# Patient Record
Sex: Female | Born: 2000 | Race: White | Hispanic: No | Marital: Single | Smoking: Former smoker
Health system: Southern US, Community
[De-identification: ages and names within clinical notes are randomized; demographics above are authoritative.]

## PROBLEM LIST (undated history)

## (undated) DIAGNOSIS — N3644 Muscular disorders of urethra: Secondary | ICD-10-CM

## (undated) DIAGNOSIS — J309 Allergic rhinitis, unspecified: Secondary | ICD-10-CM

## (undated) DIAGNOSIS — T50902A Poisoning by unspecified drugs, medicaments and biological substances, intentional self-harm, initial encounter: Secondary | ICD-10-CM

## (undated) DIAGNOSIS — T7840XA Allergy, unspecified, initial encounter: Secondary | ICD-10-CM

## (undated) DIAGNOSIS — N390 Urinary tract infection, site not specified: Secondary | ICD-10-CM

## (undated) DIAGNOSIS — F319 Bipolar disorder, unspecified: Secondary | ICD-10-CM

## (undated) DIAGNOSIS — N1370 Vesicoureteral-reflux, unspecified: Secondary | ICD-10-CM

## (undated) DIAGNOSIS — F603 Borderline personality disorder: Secondary | ICD-10-CM

## (undated) DIAGNOSIS — F419 Anxiety disorder, unspecified: Secondary | ICD-10-CM

## (undated) HISTORY — PX: NO PAST SURGERIES: SHX2092

---
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
## 2022-10-30 ENCOUNTER — Ambulatory Visit: Payer: BLUE CROSS/BLUE SHIELD | Admitting: Cardiology

## 2022-12-23 ENCOUNTER — Encounter: Payer: Self-pay | Admitting: Interventional Cardiology

## 2022-12-23 ENCOUNTER — Ambulatory Visit: Payer: BC Managed Care – PPO | Attending: Interventional Cardiology | Admitting: Interventional Cardiology

## 2022-12-23 VITALS — BP 134/54 | HR 112 | Ht 60.0 in | Wt 136.2 lb

## 2022-12-23 DIAGNOSIS — Z7289 Other problems related to lifestyle: Secondary | ICD-10-CM

## 2022-12-23 DIAGNOSIS — R002 Palpitations: Secondary | ICD-10-CM

## 2022-12-23 DIAGNOSIS — I479 Paroxysmal tachycardia, unspecified: Secondary | ICD-10-CM | POA: Diagnosis not present

## 2022-12-23 DIAGNOSIS — R0602 Shortness of breath: Secondary | ICD-10-CM

## 2022-12-23 NOTE — Patient Instructions (Signed)
Medication Instructions:  Your physician recommends that you continue on your current medications as directed. Please refer to the Current Medication list given to you today.  *If you need a refill on your cardiac medications before your next appointment, please call your pharmacy*   Lab Work: none If you have labs (blood work) drawn today and your tests are completely normal, you will receive your results only by: [place] (if you have MyChart) OR A paper copy in the mail If you have any lab test that is abnormal or we need to change your treatment, we will call you to review the results.   Testing/Procedures: Your physician has requested that you have an echocardiogram. Echocardiography is a painless test that uses sound waves to create images of your heart. It provides your doctor with information about the size and shape of your heart and how well your heart's chambers and valves are working. This procedure takes approximately one hour. There are no restrictions for this procedure. Please do NOT wear cologne, perfume, aftershave, or lotions (deodorant is allowed). Please arrive 15 minutes prior to your appointment time.    Follow-Up: At [facility], you and your health needs are our priority.  As part of our continuing mission to provide you with exceptional heart care, we have created designated Provider Care Teams.  These Care Teams include your primary Cardiologist (physician) and Advanced Practice Providers (APPs -  Physician Assistants and Nurse Practitioners) who all work together to provide you with the care you need, when you need it.  We recommend signing up for the patient portal called "MyChart".  Sign up information is provided on this After Visit Summary.  MyChart is used to connect with patients for Virtual Visits (Telemedicine).  Patients are able to view lab/test results, encounter notes, upcoming appointments, etc.  Non-urgent messages can be sent to your  provider as well.   To learn more about what you can do with MyChart, go to [email or web address].    Your next appointment:   As needed  Provider:   Larae Grooms, MD     Other Instructions

## 2022-12-23 NOTE — Progress Notes (Signed)
Cardiology Office Note   Date:  12/23/2022   ID:  Thelma Barge, DOB 11-23-2000, MRN [id number]  PCP:  Patient, No Pcp Per    No chief complaint on file.  Tachycardia  Wt Readings from Last 3 Encounters:  12/23/22 136 lb 3.2 oz (61.8 kg)  03/12/16 105 lb (47.6 kg) (23 %, Z= -0.73)*  12/07/11 70 lb 12.8 oz (32.1 kg) (15 %, Z= -1.05)*   * Growth percentiles are based on CDC (Girls, 2-20 Years) data.       History of Present Illness: [name] is a 22 y.o. female who is being seen today for the evaluation of tachycardia at the request of Dian Queen, MD.   She is 7 months pregnant.  HR has been in the 110-120 range.  She can feel palpitations since the second trimester started.  THere is associated SHOB.  SHe was referred in 12/23, but sx goot better.  Sx returned in Jan so she came to be evaluated.  No prior cardiac issues in the past.  She used cocaine and "uppers" in the past.  Last cocaine was 2023 and last meth was in 2020.  No drug use since pregnancy.  Has DOE with exertion. Going up the stairs or even on flat ground can cause sx.  Palpitaitons can cone on at rest as well.  SHe has decreased caffeine.  She is not feeling anxiety at the times.  Family h/o heart trouble.  Maternal GF had MI.  Maternal GM with CHF. Father with HTN.  Home blood pressure readings well-controlled but heart rates can range anywhere from 90s up to 120s.  Highest 131 on one occasion.  Most in the upper 90s.   He does vape, but no relation to palpitations.    Past Medical History:  Diagnosis Date   Allergic rhinitis    Allergy    Anxiety    Bipolar 1 disorder ([place])    Bladder sphincter dyssynergia    Borderline personality disorder ([place])    seasonal allergies    Suicide attempt by drug ingestion ([place])    Urinary tract infection    Vesicoureteral reflux     History reviewed. No pertinent surgical history.   Current Outpatient Medications  Medication Sig Dispense  Refill   Prenatal Vit-Fe Fumarate-FA (PRENATAL MULTIVITAMIN) TABS tablet Take 1 tablet by mouth daily at 12 noon.     buPROPion (WELLBUTRIN SR) 150 MG 12 hr tablet Take 1 tablet (150 mg total) by mouth daily. (Patient not taking: Reported on 12/23/2022) 30 tablet 0   loratadine (CLARITIN) 10 MG tablet Take 1 tablet (10 mg total) by mouth daily. (Patient not taking: Reported on 12/23/2022) 30 tablet 0   Melatonin 5 MG TABS Take 5 mg by mouth at bedtime. (Patient not taking: Reported on 12/23/2022)     Oxcarbazepine (TRILEPTAL) 300 MG tablet Take 3 tablets (900 mg total) by mouth 2 (two) times daily. (Patient not taking: Reported on 12/23/2022) 90 tablet 0   sertraline (ZOLOFT) 100 MG tablet Take 1 tablet (100 mg total) by mouth daily. (Patient not taking: Reported on 12/23/2022) 30 tablet 0   No current facility-administered medications for this visit.    Allergies:   Patient has no known allergies.    Social History:  The patient  reports that she has been smoking cigarettes. She has been smoking an average of .25 packs per day. She has never used smokeless tobacco. She reports current drug use. Drugs: Cocaine, Marijuana, Other-see comments,  and LSD.   Family History:  The patient's family history includes Bipolar disorder in her mother; Drug abuse in her mother.    ROS:  Please see the history of present illness.   Otherwise, review of systems are positive for DOE.   All other systems are reviewed and negative.    PHYSICAL EXAM: VS:  BP (!) 134/54   Pulse (!) 112   Ht 5' (1.524 m)   Wt 136 lb 3.2 oz (61.8 kg)   SpO2 98%   BMI 26.60 kg/m  , BMI Body mass index is 26.6 kg/m. GEN: Well nourished, well developed, in no acute distress HEENT: normal Neck: no JVD, carotid bruits, or masses Cardiac: RRR; 2/6 early systolicmurmur-typical pregnancy, no rubs, or gallops,no edema  Respiratory:  clear to auscultation bilaterally, normal work of breathing GI: soft, nontender, nondistended, +  BS MS: no deformity or atrophy Skin: warm and dry, no rash Neuro:  Strength and sensation are intact Psych: euthymic mood, full affect   EKG:   The ekg ordered today demonstrates sinus tach   Recent Labs: No results found for requested labs within last 365 days.   Lipid Panel    Component Value Date/Time   CHOL 166 03/15/2016 0633   TRIG 94 03/15/2016 0633   HDL 43 03/15/2016 0633   CHOLHDL 3.9 03/15/2016 0633   VLDL 19 03/15/2016 0633   LDLCALC 104 (H) 03/15/2016 [id number]     Other studies Reviewed: Additional studies/ records that were reviewed today with results demonstrating:most recent labs unavailable to me .   ASSESSMENT AND PLAN:  Tachycardia/palpitations: Appear to be sinus tachycardia.  She is already decreasing caffeine.  We spoke about the importance of staying well-hydrated.  She is avoiding stimulant drugs now.  She does continue to vape which certainly we would like her to stop for her long-term health. DOE: Check echo to evaluate for any structural heart disease.  No symptoms of heart failure noted on exam. Prior substance abuse: Now only vaping.  No prior cardiac issues.   Current medicines are reviewed at length with the patient today.  The patient concerns regarding her medicines were addressed.  The following changes have been made:  No change  Labs/ tests ordered today include:  No orders of the defined types were placed in this encounter.   Recommend 150 minutes/week of aerobic exercise Low fat, low carb, high fiber diet recommended  Disposition:   FU for echo; prn f/u unless there is a significant abnormality on echo   Signed, Larae Grooms, MD  12/23/2022 10:02 AM    [facility] [place], [place], [place]  [address] Phone: [phone]; Fax: [phone]

## 2022-12-24 ENCOUNTER — Ambulatory Visit (HOSPITAL_COMMUNITY): Payer: BC Managed Care – PPO | Attending: Interventional Cardiology

## 2022-12-24 DIAGNOSIS — R0602 Shortness of breath: Secondary | ICD-10-CM | POA: Diagnosis not present

## 2022-12-24 LAB — ECHOCARDIOGRAM COMPLETE
Area-P 1/2: 5.13 cm2
S' Lateral: 2.3 cm

## 2022-12-25 ENCOUNTER — Telehealth: Payer: Self-pay | Admitting: Interventional Cardiology

## 2022-12-25 NOTE — Telephone Encounter (Signed)
Results reviewed with patient

## 2022-12-25 NOTE — Telephone Encounter (Signed)
-----   Message from Jettie Booze, MD sent at 12/25/2022  3:45 PM EST ----- Normal LV/RV/valvular function.  Prn f/u

## 2022-12-25 NOTE — Telephone Encounter (Signed)
Patient is returning call to discuss echo results. °

## 2022-12-28 ENCOUNTER — Encounter: Payer: Self-pay | Admitting: Student

## 2022-12-28 ENCOUNTER — Inpatient Hospital Stay (HOSPITAL_COMMUNITY)
Admission: AD | Admit: 2022-12-28 | Discharge: 2022-12-28 | Disposition: A | Discharge status: Home / Self Care | Payer: BC Managed Care – PPO | Attending: Obstetrics and Gynecology | Admitting: Obstetrics and Gynecology

## 2022-12-28 DIAGNOSIS — O368130 Decreased fetal movements, third trimester, not applicable or unspecified: Secondary | ICD-10-CM | POA: Insufficient documentation

## 2022-12-28 DIAGNOSIS — O26893 Other specified pregnancy related conditions, third trimester: Secondary | ICD-10-CM | POA: Insufficient documentation

## 2022-12-28 DIAGNOSIS — O479 False labor, unspecified: Secondary | ICD-10-CM

## 2022-12-28 DIAGNOSIS — R109 Unspecified abdominal pain: Secondary | ICD-10-CM | POA: Insufficient documentation

## 2022-12-28 DIAGNOSIS — Z3A31 31 weeks gestation of pregnancy: Secondary | ICD-10-CM

## 2022-12-28 DIAGNOSIS — O4703 False labor before 37 completed weeks of gestation, third trimester: Secondary | ICD-10-CM | POA: Insufficient documentation

## 2022-12-28 LAB — URINALYSIS, ROUTINE W REFLEX MICROSCOPIC
Bilirubin Urine: NEGATIVE
Glucose, UA: NEGATIVE mg/dL
Hgb urine dipstick: NEGATIVE
Ketones, ur: NEGATIVE mg/dL
Leukocytes,Ua: NEGATIVE
Nitrite: NEGATIVE
Protein, ur: NEGATIVE mg/dL
Specific Gravity, Urine: <1.005 — ABNORMAL LOW (ref 1.005–1.030)
pH: 6.5 (ref 5.0–8.0)

## 2022-12-28 LAB — FETAL FIBRONECTIN: Fetal Fibronectin: NEGATIVE

## 2022-12-28 NOTE — MAU Note (Addendum)
[name] is a 22 y.o. at 18w3dhere in MAU reporting: had her check up at dr.  Since last appt 2 wks ago, has been feeling cramping and baby hasn't been moving as much. They did a NST, baby was moving ok, contracting every 5-6 min.  So sent over for fFN and exam  Onset of complaint: mild Pain score: mile Vitals:   12/28/22 1354  BP: 134/73  Pulse: (!) 105  Resp: 16  Temp: 98.4 F (36.9 C)  SpO2: 100%     FHT:134 Lab orders placed from triage:    Urine collected

## 2022-12-28 NOTE — MAU Provider Note (Addendum)
History     [id number]  Arrival date and time: 12/28/22 1337    Chief Complaint  Patient presents with   Abdominal Pain     HPI [name] is a 22 y.o. at 44w3dwho presents for contractions. Was in the office today for routine OB. Reports decreased fetal movement at the time so had an NST which showed she was contracting. Patient reports cramping for the last week but was told at her last visit that was normal. No change in cramping & can't tell how frequently it happens. Was sent here by her OB for FFN & cervical exam. Denies LOF, n/v/d, dysuria, vaginal bleeding, or recent intercourse. Reports good fetal movement since her office visit.   OB History     Gravida  1   Para      Term      Preterm      AB      Living         SAB      IAB      Ectopic      Multiple      Live Births              Past Medical History:  Diagnosis Date   Allergy    Anxiety    Bipolar 1 disorder ([place]    Bladder sphincter dyssynergia    Borderline personality disorder ([place]    [place]    Urinary tract infection    Vesicoureteral reflux     History reviewed. No pertinent surgical history.  Family History  Problem Relation Age of Onset   Bipolar disorder Mother    Drug abuse Mother    High blood pressure Father     No Known Allergies  No current facility-administered medications on file prior to encounter.   Current Outpatient Medications on File Prior to Encounter  Medication Sig Dispense Refill   Prenatal Vit-Fe Fumarate-FA (PRENATAL MULTIVITAMIN) TABS tablet Take 1 tablet by mouth daily at 12 noon.       ROS Pertinent positives and negative per HPI, all others reviewed and negative  Physical Exam   BP 124/75   Pulse 100   Temp 98.4 F (36.9 C) (Oral)   Resp 16   Ht 5' (1.524 m)   Wt 65.4 kg   SpO2 100%   BMI 28.16 kg/m   Patient Vitals for the past 24 hrs:  BP Temp Temp src Pulse Resp SpO2 Height Weight   12/28/22 1605 124/75 -- -- 100 -- -- -- --  12/28/22 1410 134/82 -- -- (!) 109 -- -- -- --  12/28/22 1354 134/73 98.4 F (36.9 C) Oral (!) 105 16 100 % 5' (1.524 m) 65.4 kg    Physical Exam Vitals and nursing note reviewed. Exam conducted with a chaperone present.  Constitutional:      General: She is not in acute distress. HENT:     Head: Normocephalic and atraumatic.  Pulmonary:     Effort: Pulmonary effort is normal. No respiratory distress.  Abdominal:     Palpations: Abdomen is soft.     Tenderness: There is no abdominal tenderness.     Comments: gravid  Skin:    General: Skin is warm and dry.  Neurological:     Mental Status: She is alert.      Cervical Exam Dilation: Closed Effacement (%): Thick Exam by:: EJorje Guild NP  FHT Baseline 130, moderate variability, 15x15 accels, no decels Toco:  irregular Cat: 1  Labs Results for orders placed or performed during the hospital encounter of 12/28/22 (from the past 24 hour(s))  Urinalysis, Routine w reflex microscopic -Urine, Clean Catch     Status: Abnormal   Collection Time: 12/28/22  2:19 PM  Result Value Ref Range   Color, Urine YELLOW YELLOW   APPearance CLEAR CLEAR   Specific Gravity, Urine <1.005 (L) 1.005 - 1.030   pH 6.5 5.0 - 8.0   Glucose, UA NEGATIVE NEGATIVE mg/dL   Hgb urine dipstick NEGATIVE NEGATIVE   Bilirubin Urine NEGATIVE NEGATIVE   Ketones, ur NEGATIVE NEGATIVE mg/dL   Protein, ur NEGATIVE NEGATIVE mg/dL   Nitrite NEGATIVE NEGATIVE   Leukocytes,Ua NEGATIVE NEGATIVE  Fetal fibronectin     Status: None   Collection Time: 12/28/22  2:44 PM  Result Value Ref Range   Fetal Fibronectin NEGATIVE NEGATIVE    Imaging No results found.  MAU Course  Procedures Lab Orders         Urinalysis, Routine w reflex microscopic -Urine, Clean Catch         Fetal fibronectin    No orders of the defined types were placed in this encounter.  Imaging Orders  No imaging studies ordered today    MDM Patient sent from office for FFN & cervical exam FFN negative. Cervix closed/thick/-3. No regular contractions on toco Assessment and Plan   1. Braxton Hick's contraction   2. [redacted] weeks gestation of pregnancy    -Reviewed preterm labor precautions & reasons to return to MAU -Keep scheduled f/u with OB   Jorje Guild, NP 12/28/22 5:30 PM

## 2023-01-12 ENCOUNTER — Encounter (HOSPITAL_COMMUNITY): Payer: Self-pay | Admitting: Obstetrics & Gynecology

## 2023-01-12 ENCOUNTER — Inpatient Hospital Stay (HOSPITAL_COMMUNITY)
Admission: AD | Admit: 2023-01-12 | Discharge: 2023-01-17 | DRG: 787 | Disposition: A | Discharge status: Home / Self Care | Payer: BC Managed Care – PPO | Attending: Obstetrics & Gynecology | Admitting: Obstetrics & Gynecology

## 2023-01-12 ENCOUNTER — Other Ambulatory Visit: Payer: Self-pay

## 2023-01-12 DIAGNOSIS — O42913 Preterm premature rupture of membranes, unspecified as to length of time between rupture and onset of labor, third trimester: Secondary | ICD-10-CM | POA: Diagnosis present

## 2023-01-12 DIAGNOSIS — O9852 Other viral diseases complicating childbirth: Secondary | ICD-10-CM | POA: Diagnosis present

## 2023-01-12 DIAGNOSIS — B001 Herpesviral vesicular dermatitis: Secondary | ICD-10-CM | POA: Diagnosis present

## 2023-01-12 DIAGNOSIS — O99334 Smoking (tobacco) complicating childbirth: Secondary | ICD-10-CM | POA: Diagnosis present

## 2023-01-12 DIAGNOSIS — Z9151 Personal history of suicidal behavior: Secondary | ICD-10-CM | POA: Diagnosis not present

## 2023-01-12 DIAGNOSIS — F17290 Nicotine dependence, other tobacco product, uncomplicated: Secondary | ICD-10-CM | POA: Diagnosis present

## 2023-01-12 DIAGNOSIS — O42919 Preterm premature rupture of membranes, unspecified as to length of time between rupture and onset of labor, unspecified trimester: Secondary | ICD-10-CM | POA: Diagnosis present

## 2023-01-12 DIAGNOSIS — Z8619 Personal history of other infectious and parasitic diseases: Secondary | ICD-10-CM | POA: Diagnosis not present

## 2023-01-12 DIAGNOSIS — Z3A33 33 weeks gestation of pregnancy: Secondary | ICD-10-CM | POA: Diagnosis not present

## 2023-01-12 DIAGNOSIS — O26893 Other specified pregnancy related conditions, third trimester: Secondary | ICD-10-CM | POA: Diagnosis present

## 2023-01-12 LAB — RAPID URINE DRUG SCREEN, HOSP PERFORMED
Amphetamines: NOT DETECTED
Barbiturates: NOT DETECTED
Benzodiazepines: NOT DETECTED
Cocaine: NOT DETECTED
Opiates: NOT DETECTED
Tetrahydrocannabinol: NOT DETECTED

## 2023-01-12 LAB — URINALYSIS, ROUTINE W REFLEX MICROSCOPIC
Bilirubin Urine: NEGATIVE
Glucose, UA: NEGATIVE mg/dL
Hgb urine dipstick: NEGATIVE
Ketones, ur: NEGATIVE mg/dL
Leukocytes,Ua: NEGATIVE
Nitrite: NEGATIVE
Protein, ur: NEGATIVE mg/dL
Specific Gravity, Urine: 1.002 — ABNORMAL LOW (ref 1.005–1.030)
pH: 7 (ref 5.0–8.0)

## 2023-01-12 LAB — TYPE AND SCREEN
ABO/RH(D): O POS
Antibody Screen: NEGATIVE

## 2023-01-12 LAB — HIV ANTIBODY (ROUTINE TESTING W REFLEX): HIV Screen 4th Generation wRfx: NONREACTIVE

## 2023-01-12 LAB — POCT FERN TEST: POCT Fern Test: POSITIVE

## 2023-01-12 MED ORDER — BETAMETHASONE SOD PHOS & ACET 6 (3-3) MG/ML IJ SUSP
12.0000 mg | Freq: Once | INTRAMUSCULAR | Status: AC
  Administered 2023-01-12: 12 mg via INTRAMUSCULAR
  Filled 2023-01-12: qty 5

## 2023-01-12 MED ORDER — DOCUSATE SODIUM 100 MG PO CAPS
100.0000 mg | ORAL_CAPSULE | Freq: Every day | ORAL | Status: DC
  Administered 2023-01-12 – 2023-01-13 (×2): 100 mg via ORAL
  Filled 2023-01-12 (×2): qty 1

## 2023-01-12 MED ORDER — ZOLPIDEM TARTRATE 5 MG PO TABS
5.0000 mg | ORAL_TABLET | Freq: Every evening | ORAL | Status: DC | PRN
  Administered 2023-01-12: 5 mg via ORAL
  Filled 2023-01-12: qty 1

## 2023-01-12 MED ORDER — LACTATED RINGERS IV BOLUS
1000.0000 mL | Freq: Once | INTRAVENOUS | Status: AC
  Administered 2023-01-12: 1000 mL via INTRAVENOUS

## 2023-01-12 MED ORDER — SODIUM CHLORIDE 0.9% FLUSH: 3.0000 mL | INTRAVENOUS | Status: DC | PRN

## 2023-01-12 MED ORDER — AZITHROMYCIN 250 MG PO TABS
1000.0000 mg | ORAL_TABLET | Freq: Once | ORAL | Status: AC
  Administered 2023-01-12: 1000 mg via ORAL
  Filled 2023-01-12: qty 4

## 2023-01-12 MED ORDER — CALCIUM CARBONATE ANTACID 500 MG PO CHEW: 2.0000 | CHEWABLE_TABLET | ORAL | Status: DC | PRN

## 2023-01-12 MED ORDER — AMOXICILLIN 500 MG PO CAPS: 500.0000 mg | ORAL_CAPSULE | Freq: Three times a day (TID) | ORAL | Status: DC

## 2023-01-12 MED ORDER — AZITHROMYCIN 250 MG PO TABS: 1000.0000 mg | ORAL_TABLET | Freq: Once | ORAL | Status: DC

## 2023-01-12 MED ORDER — PRENATAL MULTIVITAMIN CH
1.0000 | ORAL_TABLET | Freq: Every day | ORAL | Status: DC
  Filled 2023-01-12: qty 1

## 2023-01-12 MED ORDER — SODIUM CHLORIDE 0.9 % IV SOLN
250.0000 mL | INTRAVENOUS | Status: DC | PRN
  Administered 2023-01-12: 250 mL via INTRAVENOUS

## 2023-01-12 MED ORDER — SODIUM CHLORIDE 0.9 % IV SOLN: 250.0000 mL | INTRAVENOUS | Status: DC | PRN

## 2023-01-12 MED ORDER — VALACYCLOVIR HCL 500 MG PO TABS: 500.0000 mg | ORAL_TABLET | Freq: Two times a day (BID) | ORAL | Status: DC

## 2023-01-12 MED ORDER — ACETAMINOPHEN 325 MG PO TABS: 650.0000 mg | ORAL_TABLET | ORAL | Status: DC | PRN

## 2023-01-12 MED ORDER — SODIUM CHLORIDE 0.9 % IV SOLN
2.0000 g | Freq: Four times a day (QID) | INTRAVENOUS | Status: DC
  Administered 2023-01-12 – 2023-01-13 (×4): 2 g via INTRAVENOUS
  Filled 2023-01-12 (×6): qty 2000

## 2023-01-12 MED ORDER — SODIUM CHLORIDE 0.9% FLUSH
3.0000 mL | Freq: Two times a day (BID) | INTRAVENOUS | Status: DC
  Administered 2023-01-12 – 2023-01-13 (×2): 3 mL via INTRAVENOUS

## 2023-01-12 MED ORDER — SODIUM CHLORIDE 0.9 % IV SOLN: 2.0000 g | Freq: Once | INTRAVENOUS | Status: DC

## 2023-01-12 MED ORDER — LACTATED RINGERS IV SOLN: 125.0000 mL/h | INTRAVENOUS | Status: DC

## 2023-01-12 NOTE — H&P (Signed)
[name] is a 22 y.o. female G1 at 46w4dpresenting to MAU with c/o CTX.  On exam, watery fluid was noted and patient ruled in for PPROM; unknown time of ROM.  Patient reports CTX are stable in intensity; not worsening.  No VB.  Active FM.  Patient was visually closed on SSE in MAU and vertex presentation by u/s.  Antepartum course complicated by h/o depression/anxiety and borderline personality disorder; on no medication.  Patient is in therapy.  Patient has h/o multiple suicide attempts at age 22  Patient also has h/o substance abuse and had inpatient rehab for cocaine and alcohol.  Hx also of marijuana and LSD use; clean x 3 years.  Patient does use e-cigarettes.  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  Patient has h/o syphillis in 05/2022 and is s/p tx.    OB History     Gravida  1   Para      Term      Preterm      AB      Living         SAB      IAB      Ectopic      Multiple      Live Births             Past Medical History:  Diagnosis Date   Allergy    Anxiety    [place] 1 disorder ([place]    Bladder sphincter dyssynergia    Borderline personality disorder ([place]    [place] by drug ingestion ([place]    Urinary tract infection    Vesicoureteral reflux    Past Surgical History:  Procedure Laterality Date   NO PAST SURGERIES     Family History: family history includes Bipolar disorder in her mother; Drug abuse in her mother; High blood pressure in her father. Social History:  reports that she has quit smoking. Her smoking use included cigarettes. She smoked an average of .25 packs per day. She has never used smokeless tobacco. She reports that she does not currently use alcohol. She reports that she does not currently use drugs after having used the following drugs: Cocaine, Marijuana, Other-see comments, and LSD.     Maternal Diabetes: No Genetic Screening: Normal Maternal Ultrasounds/Referrals: Normal Fetal Ultrasounds or other Referrals:   None Maternal Substance Abuse:  Yes:  Type: Other: e cigarettes Significant Maternal Medications:  None Significant Maternal Lab Results:  None Number of Prenatal Visits:greater than 3 verified prenatal visits Other Comments:  None  Review of Systems Maternal Medical History:  Reason for admission: Rupture of membranes.   Contractions: Onset was 3-5 hours ago.   Frequency: irregular.   Perceived severity is mild.   Fetal activity: Perceived fetal activity is normal.   Last perceived fetal movement was within the past hour.   Prenatal complications: no prenatal complications Prenatal Complications - Diabetes: none.     Blood pressure (!) 120/57, pulse 84, temperature 98.2 F (36.8 C), resp. rate 18. Maternal Exam:  Uterine Assessment: Contraction strength is mild.  Contraction frequency is irregular.  Abdomen: Patient reports no abdominal tenderness. Fundal height is c/w dates.   Fetal presentation: vertex   Fetal Exam Fetal Monitor Review: Baseline rate: 120.  Variability: moderate (6-25 bpm).   Pattern: accelerations present and no decelerations.   Fetal State Assessment: Category I - tracings are normal.   Physical Exam Constitutional:      Appearance: Normal appearance.  HENT:  Head: Normocephalic and atraumatic.  Pulmonary:     Effort: Pulmonary effort is normal.  Abdominal:     Palpations: Abdomen is soft.  Musculoskeletal:        General: Normal range of motion.     Cervical back: Normal range of motion.  Skin:    General: Skin is warm and dry.  Neurological:     Mental Status: She is alert and oriented to person, place, and time.  Psychiatric:        Mood and Affect: Mood normal.        Behavior: Behavior normal.     Prenatal labs: ABO, Rh:  O pos Antibody:  Negative Rubella:  Immune RPR:   NR HBsAg:   Negative HIV:   NR GBS:   unknown  Assessment/Plan: 21yo G1 at 98w4dwith PPROM -Latency abx -BMZ -CEFM -GBS, GC/C pending -UDS -NICU  consult placed -MFM u/s tomorrow (ordered) -If CTX do not subside with IVF, will consider magnesium sulfate through BMZ maturity -Closely monitor for labor, abruption, chorio, non-reassuring fetal status  MLinda Hedges3/03/2023, 8:00 PM

## 2023-01-12 NOTE — MAU Provider Note (Signed)
History     CSN: [id number]  Arrival date and time: 01/12/23 1713   Event Date/Time   First Provider Initiated Contact with Patient 01/12/23 1750      Chief Complaint  Patient presents with   Contractions   HPI Patient is 22 y.o. G1P0 19w4dhere with complaints of contractions with some spotting. [facility] and said she was closed. She has some spotting when she wiped today. She reports contractions that were increasing in intensity today.  Reports no sudden gush of fluid. Denies recent intercourse.   +FM, denies LOF, vaginal discharge.   OB History     Gravida  1   Para      Term      Preterm      AB      Living         SAB      IAB      Ectopic      Multiple      Live Births              Past Medical History:  Diagnosis Date   Allergy    Anxiety    [place] 1 [place]    Bladder sphincter dyssynergia    [place]line [place]    [place]    Urinary tract infection    Vesicoureteral reflux     Past Surgical History:  Procedure Laterality Date   NO PAST SURGERIES      Family History  Problem Relation Age of Onset   Bipolar disorder Mother    Drug abuse Mother    High blood pressure Father     Social History   Tobacco Use   Smoking status: Former    Packs/day: 0.25    Types: Cigarettes   Smokeless tobacco: Never  Vaping Use   Vaping Use: Every day  Substance Use Topics   Alcohol use: Not Currently   Drug use: Not Currently    Types: Cocaine, Marijuana, Other-see comments, LSD    Allergies: No Known Allergies  Medications Prior to Admission  Medication Sig Dispense Refill Last Dose   Prenatal Vit-Fe Fumarate-FA (PRENATAL MULTIVITAMIN) TABS tablet Take 1 tablet by mouth daily at 12 noon.   01/12/2023    Review of Systems  Constitutional:  Negative for chills and fever.  HENT:  Negative for congestion and sore throat.    Eyes:  Negative for pain and visual disturbance.  Respiratory:  Negative for cough, chest tightness and shortness of breath.   Cardiovascular:  Negative for chest pain.  Gastrointestinal:  Negative for abdominal pain, diarrhea, nausea and vomiting.  Endocrine: Negative for cold intolerance and heat intolerance.  Genitourinary:  Negative for dysuria and flank pain.  Musculoskeletal:  Negative for back pain.  Skin:  Negative for rash.  Allergic/Immunologic: Negative for food allergies.  Neurological:  Negative for dizziness and light-headedness.  Psychiatric/Behavioral:  Negative for agitation.    Physical Exam   Blood pressure (!) 120/57, pulse 84, temperature 98.2 F (36.8 C), resp. rate 18.  Physical Exam Vitals and nursing note reviewed. Exam conducted with a chaperone present.  Constitutional:      General: She is not in acute distress.    Appearance: She is well-developed.     Comments: Pregnant female  HENT:     Head: Normocephalic and atraumatic.  Eyes:     General: No scleral icterus.  Conjunctiva/sclera: Conjunctivae normal.  Cardiovascular:     Rate and Rhythm: Normal rate.  Pulmonary:     Effort: Pulmonary effort is normal.  Chest:     Chest wall: No tenderness.  Abdominal:     Palpations: Abdomen is soft.     Tenderness: There is no abdominal tenderness. There is no guarding or rebound.     Comments: Gravid  Genitourinary:    Exam position: Lithotomy position.     Pubic Area: No rash.      Labia:        Right: No rash.        Left: No rash.      Vagina: Vaginal discharge: watery fluid, pooling in speculum.     Cervix: Normal. Cervical motion tenderness: appears closed on exam.  Musculoskeletal:        General: Normal range of motion.     Cervical back: Normal range of motion and neck supple.  Skin:    General: Skin is warm and dry.     Findings: No rash.  Neurological:     Mental Status: She is alert and oriented to person, place, and time.      MAU Course  Procedures  MDM: high  This patient presents to the ED for concern of   Chief Complaint  Patient presents with   Contractions     These complains involves an extensive number of treatment options, and is a complaint that carries with it a high risk of complications and morbidity.  The differential diagnosis for  1. Contractions with vaginal spotting INCLUDES PPROM, PTL, abruption, Infection  Co morbidities that complicate the patient evaluation: Syphilis in 05/2022 and Chlamydia in 2020  Additional history obtained from mother  Interpreter services used: not applicable  External records from outside source obtained and reviewed including Prenatal care records  Lab Tests: Other labor and delivery labs/admission labs ordered-- collected with IV insertion. FERN slide collected and read by me personally   Medicines ordered and prescription drug management:  Medications: BMZ, Ampicillin and Azithromycin  Critical Interventions: IV fluids  MAU Course: NST Interpretation-- Reactive EFM 120/moderate/+accels, +decels initially the resolved with position changes Toco Quiet  I was called urgently to the room due to inability to track FHR. US showed FHR in the 120s.  Labs were drawn urgently with IV placement. Patient given 1 L LR  Bedside US Explained limited nature of [place]. Reviewed fetal heart position. Confirmed Vertex.    Dispostion: admitted to the hospital   Assessment and Plan  Admission for PPROM Called Dr Linda Hedges for admission who agrees  #PPROM - Discussed plan of care with patient and family at bedside - ampicillin and azithromycin ordered and started - labs per admission order  #Fetal well being BMZ x1 given EFM reassuring    Caren Macadam 01/12/2023, 6:42 PM

## 2023-01-12 NOTE — Progress Notes (Signed)
Patient is feeling much better after eating dinner.  CTX are mild and stable. CVX closed in MAU prior to transfer to [facility]. Will defer magnesium sulfate for now.  Linda Hedges, DO

## 2023-01-12 NOTE — MAU Note (Signed)
.  [name] is a 22 y.o. at 37w4dhere in MAU reporting: she was in the office yesterday and had a cervical check . Cervix was closed. Today she had some bleeding when wiping an has reported ctx that are stronger than and closer together than the have been.  Onset of complaint: this afternoon Pain score: 7 Vitals:   01/12/23 1751  BP: (!) 120/57  Pulse: 84  Resp: 18  Temp: 98.2 F (36.8 C)     FHT:120 Lab orders placed from triage:  u/a

## 2023-01-13 ENCOUNTER — Encounter (HOSPITAL_COMMUNITY)
Admission: AD | Disposition: A | Discharge status: Home / Self Care | Payer: Self-pay | Source: Home / Self Care | Attending: Obstetrics & Gynecology

## 2023-01-13 ENCOUNTER — Encounter (HOSPITAL_COMMUNITY): Payer: Self-pay | Admitting: Obstetrics & Gynecology

## 2023-01-13 ENCOUNTER — Inpatient Hospital Stay (HOSPITAL_COMMUNITY): Payer: BC Managed Care – PPO

## 2023-01-13 ENCOUNTER — Inpatient Hospital Stay (HOSPITAL_COMMUNITY): Payer: BC Managed Care – PPO | Admitting: Anesthesiology

## 2023-01-13 ENCOUNTER — Other Ambulatory Visit: Payer: Self-pay

## 2023-01-13 DIAGNOSIS — O42913 Preterm premature rupture of membranes, unspecified as to length of time between rupture and onset of labor, third trimester: Secondary | ICD-10-CM

## 2023-01-13 DIAGNOSIS — Z8619 Personal history of other infectious and parasitic diseases: Secondary | ICD-10-CM

## 2023-01-13 DIAGNOSIS — Z9151 Personal history of suicidal behavior: Secondary | ICD-10-CM | POA: Diagnosis not present

## 2023-01-13 DIAGNOSIS — Z3A33 33 weeks gestation of pregnancy: Secondary | ICD-10-CM | POA: Diagnosis not present

## 2023-01-13 LAB — GC/CHLAMYDIA PROBE AMP (~~LOC~~) NOT AT ARMC
Chlamydia: NEGATIVE
Comment: NEGATIVE
Comment: NORMAL
Neisseria Gonorrhea: NEGATIVE

## 2023-01-13 LAB — CBC
HCT: 41.7 % (ref 36.0–46.0)
HCT: 35.6 % — ABNORMAL LOW (ref 36.0–46.0)
Hemoglobin: 13.9 g/dL (ref 12.0–15.0)
Hemoglobin: 12 g/dL (ref 12.0–15.0)
MCH: 31.2 pg (ref 26.0–34.0)
MCH: 30.9 pg (ref 26.0–34.0)
MCHC: 33.3 g/dL (ref 30.0–36.0)
MCHC: 33.7 g/dL (ref 30.0–36.0)
MCV: 93.5 fL (ref 80.0–100.0)
MCV: 91.8 fL (ref 80.0–100.0)
Platelets: 245 10*3/uL (ref 150–400)
Platelets: 230 10*3/uL (ref 150–400)
RBC: 4.46 MIL/uL (ref 3.87–5.11)
RBC: 3.88 MIL/uL (ref 3.87–5.11)
RDW: 12.8 % (ref 11.5–15.5)
RDW: 12.6 % (ref 11.5–15.5)
WBC: 9.1 10*3/uL (ref 4.0–10.5)
WBC: 10.3 10*3/uL (ref 4.0–10.5)
nRBC: 0 % (ref 0.0–0.2)
nRBC: 0 % (ref 0.0–0.2)

## 2023-01-13 SURGERY — Surgical Case
Anesthesia: Spinal

## 2023-01-13 MED ORDER — MENTHOL 3 MG MT LOZG: 1.0000 | LOZENGE | OROMUCOSAL | Status: DC | PRN

## 2023-01-13 MED ORDER — CEFAZOLIN SODIUM-DEXTROSE 2-4 GM/100ML-% IV SOLN
INTRAVENOUS | Status: AC
  Filled 2023-01-13: qty 100

## 2023-01-13 MED ORDER — LACTATED RINGERS IV BOLUS
250.0000 mL | Freq: Once | INTRAVENOUS | Status: AC
  Administered 2023-01-13: 250 mL via INTRAVENOUS

## 2023-01-13 MED ORDER — LACTATED RINGERS IV SOLN: INTRAVENOUS | Status: DC

## 2023-01-13 MED ORDER — KETOROLAC TROMETHAMINE 30 MG/ML IJ SOLN: 30.0000 mg | Freq: Four times a day (QID) | INTRAMUSCULAR | Status: AC | PRN

## 2023-01-13 MED ORDER — BETAMETHASONE SOD PHOS & ACET 6 (3-3) MG/ML IJ SUSP
12.0000 mg | Freq: Once | INTRAMUSCULAR | Status: DC
  Filled 2023-01-13: qty 2

## 2023-01-13 MED ORDER — OXYTOCIN-SODIUM CHLORIDE 30-0.9 UT/500ML-% IV SOLN: 2.5000 [IU]/h | INTRAVENOUS | Status: DC

## 2023-01-13 MED ORDER — ACETAMINOPHEN 10 MG/ML IV SOLN
INTRAVENOUS | Status: DC | PRN
  Administered 2023-01-13: 1000 mg via INTRAVENOUS

## 2023-01-13 MED ORDER — BUPIVACAINE IN DEXTROSE 0.75-8.25 % IT SOLN
INTRATHECAL | Status: DC | PRN
  Administered 2023-01-13: 1.4 mL via INTRATHECAL

## 2023-01-13 MED ORDER — PHENYLEPHRINE HCL-NACL 20-0.9 MG/250ML-% IV SOLN
INTRAVENOUS | Status: AC
  Filled 2023-01-13: qty 250

## 2023-01-13 MED ORDER — SODIUM CHLORIDE 0.9% FLUSH: 3.0000 mL | INTRAVENOUS | Status: DC | PRN

## 2023-01-13 MED ORDER — SODIUM CHLORIDE 0.9 % IV SOLN: 1.0000 g | INTRAVENOUS | Status: DC

## 2023-01-13 MED ORDER — FENTANYL CITRATE (PF) 100 MCG/2ML IJ SOLN: 50.0000 ug | INTRAMUSCULAR | Status: DC | PRN

## 2023-01-13 MED ORDER — CEFAZOLIN SODIUM-DEXTROSE 2-3 GM-%(50ML) IV SOLR
INTRAVENOUS | Status: DC | PRN
  Administered 2023-01-13: 2 g via INTRAVENOUS

## 2023-01-13 MED ORDER — OXYCODONE HCL 5 MG PO TABS
5.0000 mg | ORAL_TABLET | ORAL | Status: DC | PRN
  Administered 2023-01-14 – 2023-01-15 (×6): 10 mg via ORAL
  Administered 2023-01-16: 5 mg via ORAL
  Filled 2023-01-13: qty 2
  Filled 2023-01-13 (×2): qty 1
  Filled 2023-01-13: qty 2
  Filled 2023-01-13: qty 1
  Filled 2023-01-13 (×3): qty 2

## 2023-01-13 MED ORDER — COCONUT OIL OIL
1.0000 | TOPICAL_OIL | Status: DC | PRN
  Administered 2023-01-16: 1 via TOPICAL

## 2023-01-13 MED ORDER — FLEET ENEMA 7-19 GM/118ML RE ENEM: 1.0000 | ENEMA | RECTAL | Status: DC | PRN

## 2023-01-13 MED ORDER — LACTATED RINGERS IV SOLN: 500.0000 mL | INTRAVENOUS | Status: DC | PRN

## 2023-01-13 MED ORDER — NALOXONE HCL 4 MG/10ML IJ SOLN: 1.0000 ug/kg/h | INTRAVENOUS | Status: DC | PRN

## 2023-01-13 MED ORDER — KETOROLAC TROMETHAMINE 30 MG/ML IJ SOLN
INTRAMUSCULAR | Status: AC
  Filled 2023-01-13: qty 1

## 2023-01-13 MED ORDER — ONDANSETRON HCL 4 MG/2ML IJ SOLN: 4.0000 mg | Freq: Four times a day (QID) | INTRAMUSCULAR | Status: DC | PRN

## 2023-01-13 MED ORDER — MAGNESIUM SULFATE 40 GM/1000ML IV SOLN
2.0000 g/h | INTRAVENOUS | Status: DC
  Administered 2023-01-13: 2 g/h via INTRAVENOUS
  Filled 2023-01-13: qty 1000

## 2023-01-13 MED ORDER — KETOROLAC TROMETHAMINE 30 MG/ML IJ SOLN
30.0000 mg | Freq: Four times a day (QID) | INTRAMUSCULAR | Status: AC | PRN
  Administered 2023-01-13 (×2): 30 mg via INTRAVENOUS
  Filled 2023-01-13: qty 1

## 2023-01-13 MED ORDER — OXYCODONE-ACETAMINOPHEN 5-325 MG PO TABS: 2.0000 | ORAL_TABLET | ORAL | Status: DC | PRN

## 2023-01-13 MED ORDER — OXYTOCIN-SODIUM CHLORIDE 30-0.9 UT/500ML-% IV SOLN
INTRAVENOUS | Status: AC
  Filled 2023-01-13: qty 500

## 2023-01-13 MED ORDER — NALOXONE HCL 0.4 MG/ML IJ SOLN: 0.4000 mg | INTRAMUSCULAR | Status: DC | PRN

## 2023-01-13 MED ORDER — PRENATAL MULTIVITAMIN CH
1.0000 | ORAL_TABLET | Freq: Every day | ORAL | Status: DC
  Administered 2023-01-14 – 2023-01-16 (×2): 1 via ORAL
  Filled 2023-01-13 (×4): qty 1

## 2023-01-13 MED ORDER — ACETAMINOPHEN 325 MG PO TABS: 650.0000 mg | ORAL_TABLET | ORAL | Status: DC | PRN

## 2023-01-13 MED ORDER — DIPHENHYDRAMINE HCL 50 MG/ML IJ SOLN
12.5000 mg | INTRAMUSCULAR | Status: DC | PRN
  Administered 2023-01-13: 12.5 mg via INTRAVENOUS

## 2023-01-13 MED ORDER — DEXAMETHASONE SODIUM PHOSPHATE 10 MG/ML IJ SOLN
INTRAMUSCULAR | Status: AC
  Filled 2023-01-13: qty 1

## 2023-01-13 MED ORDER — DIPHENHYDRAMINE HCL 25 MG PO CAPS: 25.0000 mg | ORAL_CAPSULE | Freq: Four times a day (QID) | ORAL | Status: DC | PRN

## 2023-01-13 MED ORDER — DIBUCAINE (PERIANAL) 1 % EX OINT: 1.0000 | TOPICAL_OINTMENT | CUTANEOUS | Status: DC | PRN

## 2023-01-13 MED ORDER — SOD CITRATE-CITRIC ACID 500-334 MG/5ML PO SOLN
30.0000 mL | ORAL | Status: DC | PRN
  Administered 2023-01-13: 30 mL via ORAL
  Filled 2023-01-13: qty 30

## 2023-01-13 MED ORDER — MAGNESIUM SULFATE BOLUS VIA INFUSION
4.0000 g | Freq: Once | INTRAVENOUS | Status: AC
  Administered 2023-01-13: 4 g via INTRAVENOUS
  Filled 2023-01-13: qty 1000

## 2023-01-13 MED ORDER — PRENATAL MULTIVITAMIN CH: 1.0000 | ORAL_TABLET | Freq: Every day | ORAL | Status: DC

## 2023-01-13 MED ORDER — LIDOCAINE HCL (PF) 1 % IJ SOLN: 30.0000 mL | INTRAMUSCULAR | Status: DC | PRN

## 2023-01-13 MED ORDER — FENTANYL CITRATE (PF) 100 MCG/2ML IJ SOLN
INTRAMUSCULAR | Status: AC
  Filled 2023-01-13: qty 2

## 2023-01-13 MED ORDER — MORPHINE SULFATE (PF) 0.5 MG/ML IJ SOLN
INTRAMUSCULAR | Status: DC | PRN
  Administered 2023-01-13: .15 mg via INTRATHECAL

## 2023-01-13 MED ORDER — SODIUM CHLORIDE 0.9 % IV SOLN: 2.0000 g | Freq: Once | INTRAVENOUS | Status: DC

## 2023-01-13 MED ORDER — SIMETHICONE 80 MG PO CHEW: 80.0000 mg | CHEWABLE_TABLET | ORAL | Status: DC | PRN

## 2023-01-13 MED ORDER — FENTANYL CITRATE (PF) 100 MCG/2ML IJ SOLN
INTRAMUSCULAR | Status: DC | PRN
  Administered 2023-01-13: 15 ug via INTRATHECAL

## 2023-01-13 MED ORDER — MEPERIDINE HCL 25 MG/ML IJ SOLN: 6.2500 mg | INTRAMUSCULAR | Status: DC | PRN

## 2023-01-13 MED ORDER — FENTANYL CITRATE (PF) 100 MCG/2ML IJ SOLN: 25.0000 ug | INTRAMUSCULAR | Status: DC | PRN

## 2023-01-13 MED ORDER — LACTATED RINGERS IV SOLN: INTRAVENOUS | Status: DC | PRN

## 2023-01-13 MED ORDER — STERILE WATER FOR IRRIGATION IR SOLN
Status: DC | PRN
  Administered 2023-01-13: 1

## 2023-01-13 MED ORDER — OXYTOCIN BOLUS FROM INFUSION: 333.0000 mL | Freq: Once | INTRAVENOUS | Status: DC

## 2023-01-13 MED ORDER — OXYCODONE-ACETAMINOPHEN 5-325 MG PO TABS: 1.0000 | ORAL_TABLET | ORAL | Status: DC | PRN

## 2023-01-13 MED ORDER — SENNOSIDES-DOCUSATE SODIUM 8.6-50 MG PO TABS
2.0000 | ORAL_TABLET | Freq: Every day | ORAL | Status: DC
  Administered 2023-01-14 – 2023-01-17 (×4): 2 via ORAL
  Filled 2023-01-13 (×4): qty 2

## 2023-01-13 MED ORDER — MORPHINE SULFATE (PF) 0.5 MG/ML IJ SOLN
INTRAMUSCULAR | Status: AC
  Filled 2023-01-13: qty 10

## 2023-01-13 MED ORDER — DEXAMETHASONE SODIUM PHOSPHATE 10 MG/ML IJ SOLN
INTRAMUSCULAR | Status: DC | PRN
  Administered 2023-01-13: 10 mg via INTRAVENOUS

## 2023-01-13 MED ORDER — PHENYLEPHRINE HCL-NACL 20-0.9 MG/250ML-% IV SOLN
INTRAVENOUS | Status: DC | PRN
  Administered 2023-01-13: 30 ug/min via INTRAVENOUS

## 2023-01-13 MED ORDER — HYDROMORPHONE HCL 1 MG/ML IJ SOLN
0.2000 mg | INTRAMUSCULAR | Status: DC | PRN
  Administered 2023-01-13 – 2023-01-14 (×2): 0.2 mg via INTRAVENOUS
  Filled 2023-01-13 (×2): qty 1

## 2023-01-13 MED ORDER — OXYTOCIN-SODIUM CHLORIDE 30-0.9 UT/500ML-% IV SOLN: 2.5000 [IU]/h | INTRAVENOUS | Status: AC

## 2023-01-13 MED ORDER — SODIUM CHLORIDE 0.9 % IV SOLN
INTRAVENOUS | Status: AC
  Filled 2023-01-13: qty 5

## 2023-01-13 MED ORDER — SCOPOLAMINE 1 MG/3DAYS TD PT72
MEDICATED_PATCH | TRANSDERMAL | Status: DC | PRN
  Administered 2023-01-13: 1 via TRANSDERMAL

## 2023-01-13 MED ORDER — ONDANSETRON HCL 4 MG/2ML IJ SOLN
4.0000 mg | Freq: Four times a day (QID) | INTRAMUSCULAR | Status: DC | PRN
  Administered 2023-01-13 (×2): 4 mg via INTRAVENOUS
  Filled 2023-01-13: qty 2

## 2023-01-13 MED ORDER — ONDANSETRON HCL 4 MG/2ML IJ SOLN: 4.0000 mg | Freq: Three times a day (TID) | INTRAMUSCULAR | Status: DC | PRN

## 2023-01-13 MED ORDER — SIMETHICONE 80 MG PO CHEW
80.0000 mg | CHEWABLE_TABLET | Freq: Three times a day (TID) | ORAL | Status: DC
  Administered 2023-01-14 – 2023-01-17 (×8): 80 mg via ORAL
  Filled 2023-01-13 (×8): qty 1

## 2023-01-13 MED ORDER — WITCH HAZEL-GLYCERIN EX PADS: 1.0000 | MEDICATED_PAD | CUTANEOUS | Status: DC | PRN

## 2023-01-13 MED ORDER — ZOLPIDEM TARTRATE 5 MG PO TABS: 5.0000 mg | ORAL_TABLET | Freq: Every evening | ORAL | Status: DC | PRN

## 2023-01-13 MED ORDER — ACETAMINOPHEN 500 MG PO TABS
1000.0000 mg | ORAL_TABLET | Freq: Four times a day (QID) | ORAL | Status: DC
  Administered 2023-01-13 – 2023-01-17 (×14): 1000 mg via ORAL
  Filled 2023-01-13 (×15): qty 2

## 2023-01-13 MED ORDER — DIPHENHYDRAMINE HCL 50 MG/ML IJ SOLN
INTRAMUSCULAR | Status: AC
  Filled 2023-01-13: qty 1

## 2023-01-13 MED ORDER — OXYTOCIN-SODIUM CHLORIDE 30-0.9 UT/500ML-% IV SOLN
INTRAVENOUS | Status: DC | PRN
  Administered 2023-01-13: 300 mL via INTRAVENOUS

## 2023-01-13 MED ORDER — SODIUM CHLORIDE 0.9 % IV SOLN
INTRAVENOUS | Status: DC | PRN
  Administered 2023-01-13: 500 mg via INTRAVENOUS

## 2023-01-13 MED ORDER — ONDANSETRON HCL 4 MG/2ML IJ SOLN
INTRAMUSCULAR | Status: AC
  Filled 2023-01-13: qty 2

## 2023-01-13 MED ORDER — DIPHENHYDRAMINE HCL 25 MG PO CAPS: 25.0000 mg | ORAL_CAPSULE | ORAL | Status: DC | PRN

## 2023-01-13 SURGICAL SUPPLY — 33 items
BENZOIN TINCTURE PRP APPL 2/3 (GAUZE/BANDAGES/DRESSINGS) IMPLANT
CHLORAPREP W/TINT 26 (MISCELLANEOUS) ×2 IMPLANT
CLAMP UMBILICAL CORD (MISCELLANEOUS) ×1 IMPLANT
CLOTH BEACON ORANGE TIMEOUT ST (SAFETY) ×1 IMPLANT
DERMABOND ADVANCED .7 DNX12 (GAUZE/BANDAGES/DRESSINGS) IMPLANT
DRSG OPSITE POSTOP 4X10 (GAUZE/BANDAGES/DRESSINGS) ×1 IMPLANT
ELECT REM PT RETURN 9FT ADLT (ELECTROSURGICAL) ×1
ELECTRODE REM PT RTRN 9FT ADLT (ELECTROSURGICAL) ×1 IMPLANT
EXTRACTOR VACUUM M CUP 4 TUBE (SUCTIONS) IMPLANT
GAUZE PAD ABD 7.5X8 STRL (GAUZE/BANDAGES/DRESSINGS) IMPLANT
GAUZE SPONGE 4X4 12PLY STRL LF (GAUZE/BANDAGES/DRESSINGS) IMPLANT
GLOVE BIO SURGEON STRL SZ7.5 (GLOVE) ×1 IMPLANT
GLOVE BIOGEL PI IND STRL 7.0 (GLOVE) ×1 IMPLANT
GOWN STRL REUS W/TWL LRG LVL3 (GOWN DISPOSABLE) ×2 IMPLANT
KIT ABG SYR 3ML LUER SLIP (SYRINGE) ×1 IMPLANT
MAT PREVALON FULL STRYKER (MISCELLANEOUS) IMPLANT
NDL HYPO 25X5/8 SAFETYGLIDE (NEEDLE) ×1 IMPLANT
NEEDLE HYPO 25X5/8 SAFETYGLIDE (NEEDLE) ×1 IMPLANT
NS IRRIG 1000ML POUR BTL (IV SOLUTION) ×1 IMPLANT
PACK C SECTION WH (CUSTOM PROCEDURE TRAY) ×1 IMPLANT
PAD OB MATERNITY 4.3X12.25 (PERSONAL CARE ITEMS) ×1 IMPLANT
STRIP CLOSURE SKIN 1/2X4 (GAUZE/BANDAGES/DRESSINGS) IMPLANT
SUT MNCRL 0 VIOLET CTX 36 (SUTURE) ×4 IMPLANT
SUT MONOCRYL 0 CTX 36 (SUTURE) ×5
SUT PDS AB 0 CTX 60 (SUTURE) ×1 IMPLANT
SUT PLAIN 0 NONE (SUTURE) IMPLANT
SUT PLAIN 2 0 (SUTURE)
SUT PLAIN 2 0 XLH (SUTURE) IMPLANT
SUT PLAIN ABS 2-0 CT1 27XMFL (SUTURE) IMPLANT
SUT VIC AB 4-0 KS 27 (SUTURE) ×1 IMPLANT
TOWEL OR 17X24 6PK STRL BLUE (TOWEL DISPOSABLE) ×1 IMPLANT
TRAY FOLEY W/BAG SLVR 14FR LF (SET/KITS/TRAYS/PACK) ×1 IMPLANT
WATER STERILE IRR 1000ML POUR (IV SOLUTION) ×1 IMPLANT

## 2023-01-13 NOTE — Transfer of Care (Signed)
Immediate Anesthesia Transfer of Care Note  Patient: [name]  Procedure(s) Performed: CESAREAN SECTION  Patient Location: PACU  Anesthesia Type:Spinal  Level of Consciousness: awake  Airway & Oxygen Therapy: Patient Spontanous Breathing  Post-op Assessment: Report given to RN and Post -op Vital signs reviewed and stable  Post vital signs: Reviewed and stable  Last Vitals:  Vitals Value Taken Time  BP    Temp    Pulse 79 01/13/23 1635  Resp    SpO2 98 % 01/13/23 1635  Vitals shown include unvalidated device data.  Last Pain:  Vitals:   01/13/23 1503  TempSrc:   PainSc: 3          Complications: No notable events documented.

## 2023-01-13 NOTE — Op Note (Signed)
[name], [name] MEDICAL RECORD NO: [id number] ACCOUNT NO: [id number] DATE OF BIRTH: 2000/12/08 FACILITY: [facility] LOCATION: [facility] PHYSICIAN: Daleen Bo. Lyn Hollingshead, MD  Operative Report   DATE OF PROCEDURE: 01/13/2023  PREOPERATIVE DIAGNOSIS:   1.  Intrauterine pregnancy at 33-5/7 weeks. 2.  Preterm premature rupture of membranes. 3. Nonreassuring fetal heart tracing.  POSTOPERATIVE DIAGNOSIS:   1.  Intrauterine pregnancy at 33-5/7 weeks. 2.  Preterm premature rupture of membranes. 3. Nonreassuring fetal heart tracing.  PROCEDURE:  Low transverse cesarean section.  SURGEON:  Daleen Bo. Lyn Hollingshead, MD  ANESTHESIA:  Spinal. Josephine Igo, MD  FINDINGS:  Viable female infant. Apgars, arterial cord pH, birth weight pending.  ESTIMATED BLOOD LOSS:  513 mL  INDICATIONS AND CONSENT:  This patient is a 22 year old G1 P0 at 33-5/7 weeks who was admitted yesterday with premature rupture of membranes for an unknown length of time.  She and the baby were stable.  She was afebrile.  She was not actively  contracting and was vertex.  She was admitted to antepartum floor.  She was placed on prophylactic antibiotics and betamethasone was started.  Baby did have a spontaneous deceleration for about 5 minutes, which recovered with position change.  She was  given IV fluids.  After that, the fetal heart tone was category 1.  However, she started to feel pelvic and rectal pressure, and examination revealed cervical change with the cervix being 1 cm dilated, 90% effaced, -2 station.  She was started on  magnesium sulfate to try and allow betamethasone fetal lung maturation.  Baby was active and difficult to trace, but multiple decels were audible.  She was also feeling contractions in spite of the magnesium and examination revealed cervical change to  1-2 cm complete effacement, -2 station and a bulging bag of water.  Because of the progressive preterm labor in the face of suspected deceleration, the  magnesium was stopped and she was transferred to labor and delivery.  On labor and delivery, the baby  was having spontaneous frequent decelerations to the 60s-80s unresponsive to fluid and position change.  Therefore, urgent cesarean section was recommended.  Potential risks and complications were discussed preoperatively including but not limited to  infection, organ damage, bleeding requiring transfusion of blood products with HIV and hepatitis acquisition, DVT, PE, pneumonia.  The patient states she understands and agrees, and consent is signed and on the chart.  DESCRIPTION OF PROCEDURE:  The patient was taken to the operating room where she was identified.  Fetal heart tones are in the normal range at that time.  Spinal anesthetic was placed per Dr. Royce Macadamia.  She was then prepped vaginally with Betadine.  Foley  catheter was placed and prepped abdominally with ChloraPrep.  After 3 minute drying time, she was draped in a sterile fashion.  A timeout has been done.  After testing for adequate spinal anesthesia, skin was entered through a Pfannenstiel incision and  dissection was carried out in layers to the peritoneum.  Peritoneum was taken down superiorly and inferiorly.  Vesicouterine peritoneum was taken down cephalad laterally and the bladder flap was developed.  Uterus was incised in a low transverse manner  and the uterine cavity was entered bluntly with a hemostat.  Clear fluid was noted.  The incision was extended with the fingers.  The vertex was then delivered without difficulty.  The remainder of the baby was delivered without difficulty.  Cry and tone  is noted.  After 1 minute time, the cord was  clamped and cut and the baby was handed to waiting pediatrics team.  Placenta was delivered and sent to pathology.  Uterine cavity is clean.  Uterus was closed in 2 running locking imbricating layers of 0  Monocryl suture, which achieved good hemostasis.  Lavage was carried out and all returned as  clear.  Anterior peritoneum was closed in running fashion with 0 Monocryl suture which was also used to reapproximate the pyramidalis muscle in the midline.   Anterior rectus fascia was closed in running fashion with a 0 looped PDS.  Subcutaneous layer was closed with interrupted plain and the skin was closed in a subcuticular fashion with a 4-0 Vicryl suture.  Benzoin, Steri-Strips, honeycomb and pressure  dressing are applied.  All counts were correct and the patient was taken to recovery room in stable condition.   VAI D: 01/13/2023 4:27:31 pm T: 01/13/2023 8:57:00 pm  JOB: [id number]

## 2023-01-13 NOTE — Progress Notes (Signed)
Upon arrival to L&D FHT noted to have multiple decels to 60s unresponsive to position change Rec urgent C/S Risks D/W patient and mother including infection, organ damage, bleeding/transfusion-HIV/Hep, DVT/PE, pneumonia She states she understands and agrees.

## 2023-01-13 NOTE — Consult Note (Signed)
Consultation Service: Neonatology   Dr. Everlene Farrier has asked for consultation on [name] regarding the care of a premature infant at 28. Thank you for inviting [place] to see this patient.   Reason for consult:  Explain the possible complications, the prognosis, and the care of a premature infant at 3 and 5/7 weeks.  Chief complaint: 22 y.o. female with a singleton female IUP with an estimated weight of 2183 grams. Pregnancy has been complicated by PPROM.  Plan is for delivery via ceasarean section/vaginal delivery if possible.  My key findings of this patient's HPI are:  I have reviewed the patient's chart and have met with her. The salient information is as follows:   Mom is admitted to [facility] specialty care for PPROM and recent decelerations. Started magnesium, latency antibiotics, and betamethasone with continuous monitoring (cephalic presentation so hoping for vaginal IOL at 34 weeks once BMZ complete however may require c-section for fetal distress.  Prenatal labs:   Prenatal care:   good Pregnancy complications:  mental illness Maternal antibiotics: This patient's mother is not on file. Maternal Steroids: Betamethasone  Most recent dose:  01/12/23 at 1843   My recommendations for this patient and my actions included:   1. In the presence of the [employer] and her mother, I spent 15 minutes discussing the possible complications and outcomes of prematurity at this gestational age. I discussed specific complications at this gestational age referencing the need for resuscitation at birth due to respiratory distress which may require mechanical ventilation, CPAP, and surfactant administration. In addition infant may require IV fluids pending establishment of enteral feeds (encouraged breast milk feeding), antibiotics for possible sepsis, temperature support, and continuous monitoring. I also discussed the potential risk of complications such as hypoglycemia, slow feeding,  hyperbilirubinemia, and temp instability. I discussed this with parents in detail and they expressed an understanding of the risks and complications of prematurity.   2. I also discussed the expected survival of an infant born at 64 weeks, which is (Very Good). We further discussed that (Very Few) of the neonates born at this age have profound or severe neurological complications and school difficulties. In addition, (Few) of the neonates born at this age will have some for of mild to moderate neurological complications. She expressed an understanding of this information.   3. I informed her that the NICU team would be present at the delivery. She agreed that all appropriate medical measures could be taken to resuscitate her infant at the delivery. She also understood that our team will always be available for any questions that come up during their infant's hospitalization and we will continue to partner with their family to support them through this difficult time. Visitation policy was discussed and all questions were addressed.   Final Impression:  22 y.o. female with a singleton female IUP named "[name]" who is threatening to deliver and who now understands the possible complications and prognosis of her infant. The mother agrees with plan for resuscitation and ICU care. [name]'s questions were answered. She is planning to try and provide breast milk for her infant.    ______________________________________________________________________  Thank you for asking [place] to participate in the care of this patient. Please do not hesitate to contact us again if you are aware of any further ways we can be of assistance.   Sincerely,  Victoria Circle, MD Attending Neonatologist   I spent ~35 minutes in consultation time, of which 15 minutes was spent in direct  face to face counseling.

## 2023-01-13 NOTE — Anesthesia Preprocedure Evaluation (Signed)
Anesthesia Evaluation  Patient identified by MRN, date of birth, ID band Patient awake    Reviewed: Allergy & Precautions, NPO status , Patient's Chart, lab work & pertinent test results  Airway Mallampati: II  TM Distance: >3 FB     Dental no notable dental hx. (+) Teeth Intact, Dental Advisory Given   Pulmonary neg pulmonary ROS, former smoker   Pulmonary exam normal breath sounds clear to auscultation       Cardiovascular negative cardio ROS Normal cardiovascular exam Rhythm:Regular Rate:Normal     Neuro/Psych  PSYCHIATRIC DISORDERS Anxiety Depression Bipolar Disorder      GI/Hepatic Neg liver ROS,GERD  ,,  Endo/Other  negative endocrine ROS    Renal/GU negative Renal ROS  negative genitourinary   Musculoskeletal   Abdominal   Peds  Hematology  (+) Blood dyscrasia, anemia   Anesthesia Other Findings   Reproductive/Obstetrics (+) Pregnancy 33 weeks Non reassuring FHR tracing                              Anesthesia Physical Anesthesia Plan  ASA: 2 and emergent  Anesthesia Plan: Spinal   Post-op Pain Management: Minimal or no pain anticipated and Regional block*   Induction:   PONV Risk Score and Plan: 4 or greater and Scopolamine patch - Pre-op and Treatment may vary due to age or medical condition  Airway Management Planned: Natural Airway  Additional Equipment:   Intra-op Plan:   Post-operative Plan:   Informed Consent: I have reviewed the patients History and Physical, chart, labs and discussed the procedure including the risks, benefits and alternatives for the proposed anesthesia with the patient or authorized representative who has indicated his/her understanding and acceptance.     Dental advisory given  Plan Discussed with: CRNA and Anesthesiologist  Anesthesia Plan Comments:          Anesthesia Quick Evaluation

## 2023-01-13 NOTE — Anesthesia Postprocedure Evaluation (Signed)
Anesthesia Post Note  Patient: Scientist, research (medical)  Procedure(s) Performed: CESAREAN SECTION     Patient location during evaluation: PACU Anesthesia Type: Spinal Level of consciousness: oriented and awake and alert Pain management: pain level controlled Vital Signs Assessment: post-procedure vital signs reviewed and stable Respiratory status: spontaneous breathing, respiratory function stable and nonlabored ventilation Cardiovascular status: blood pressure returned to baseline and stable Postop Assessment: no headache, no backache, no apparent nausea or vomiting, patient able to bend at knees and spinal receding Anesthetic complications: no   No notable events documented.  Last Vitals:  Vitals:   01/13/23 1700 01/13/23 1715  BP: (!) 90/48 99/73  Pulse: 82 82  Resp: 17 15  Temp: 36.4 C   SpO2: 94% 98%    Last Pain:  Vitals:   01/13/23 1715  TempSrc:   PainSc: 0-No pain   Pain Goal:    LLE Motor Response: Purposeful movement (01/13/23 1715) LLE Sensation: Decreased (01/13/23 1715) RLE Motor Response: Purposeful movement (01/13/23 1715) RLE Sensation: Tingling (01/13/23 1715)     Epidural/Spinal Function Cutaneous sensation: Tingles (01/13/23 1715), Patient able to flex knees: Yes (01/13/23 1715), Patient able to lift hips off bed: No (01/13/23 1715), Back pain beyond tenderness at insertion site: No (01/13/23 1715), Progressively worsening motor and/or sensory loss: No (01/13/23 1715), Bowel and/or bladder incontinence post epidural: No (01/13/23 1715)  Shanta Hartner A.

## 2023-01-13 NOTE — Progress Notes (Signed)
No C/O  About 1052 a spontaneous 5 minute decel to 60s noted. Corrected with position.  Now cat one UCs-irritability No bleeding  Results for orders placed or performed during the hospital encounter of 01/12/23 (from the past 24 hour(s))  Urinalysis, Routine w reflex microscopic -Urine, Clean Catch     Status: Abnormal   Collection Time: 01/12/23  5:37 PM  Result Value Ref Range   Color, Urine STRAW (A) YELLOW   APPearance CLEAR CLEAR   Specific Gravity, Urine 1.002 (L) 1.005 - 1.030   pH 7.0 5.0 - 8.0   Glucose, UA NEGATIVE NEGATIVE mg/dL   Hgb urine dipstick NEGATIVE NEGATIVE   Bilirubin Urine NEGATIVE NEGATIVE   Ketones, ur NEGATIVE NEGATIVE mg/dL   Protein, ur NEGATIVE NEGATIVE mg/dL   Nitrite NEGATIVE NEGATIVE   Leukocytes,Ua NEGATIVE NEGATIVE  HIV Antibody (routine testing w rflx)     Status: None   Collection Time: 01/12/23  6:09 PM  Result Value Ref Range   HIV Screen 4th Generation wRfx Non Reactive Non Reactive  CBC     Status: None   Collection Time: 01/12/23  6:09 PM  Result Value Ref Range   WBC 9.1 4.0 - 10.5 K/uL   RBC 4.46 3.87 - 5.11 MIL/uL   Hemoglobin 13.9 12.0 - 15.0 g/dL   HCT 41.7 36.0 - 46.0 %   MCV 93.5 80.0 - 100.0 fL   MCH 31.2 26.0 - 34.0 pg   MCHC 33.3 30.0 - 36.0 g/dL   RDW 12.8 11.5 - 15.5 %   Platelets 245 150 - 400 K/uL   nRBC 0.0 0.0 - 0.2 %  Type and screen [place]     Status: None   Collection Time: 01/12/23  6:10 PM  Result Value Ref Range   ABO/RH(D) O POS    Antibody Screen NEG    Sample Expiration      01/15/2023,2359 Performed at [facility], [address], [place], [place] [address]   Maryann Alar Test     Status: Abnormal   Collection Time: 01/12/23  6:27 PM  Result Value Ref Range   POCT Fern Test Positive = ruptured amniotic membanes   Rapid urine drug screen (hospital performed)     Status: None   Collection Time: 01/12/23  9:19 PM  Result Value Ref Range   Opiates NONE DETECTED NONE DETECTED    Cocaine NONE DETECTED NONE DETECTED   Benzodiazepines NONE DETECTED NONE DETECTED   Amphetamines NONE DETECTED NONE DETECTED   Tetrahydrocannabinol NONE DETECTED NONE DETECTED   Barbiturates NONE DETECTED NONE DETECTED  CBC     Status: Abnormal   Collection Time: 01/13/23 11:13 AM  Result Value Ref Range   WBC 10.3 4.0 - 10.5 K/uL   RBC 3.88 3.87 - 5.11 MIL/uL   Hemoglobin 12.0 12.0 - 15.0 g/dL   HCT 35.6 (L) 36.0 - 46.0 %   MCV 91.8 80.0 - 100.0 fL   MCH 30.9 26.0 - 34.0 pg   MCHC 33.7 30.0 - 36.0 g/dL   RDW 12.6 11.5 - 15.5 %   Platelets 230 150 - 400 K/uL   nRBC 0.0 0.0 - 0.2 %    A/P: IV fluid bolus done        NPO        Continue careful observation

## 2023-01-13 NOTE — Anesthesia Procedure Notes (Signed)
Spinal  Patient location during procedure: OR Start time: 01/13/2023 3:18 PM End time: 01/13/2023 3:21 PM Reason for block: surgical anesthesia Staffing Performed: anesthesiologist  Anesthesiologist: Josephine Igo, MD Performed by: Josephine Igo, MD Authorized by: Josephine Igo, MD   Preanesthetic Checklist Completed: patient identified, IV checked, site marked, risks and benefits discussed, surgical consent, monitors and equipment checked, pre-op evaluation and timeout performed Spinal Block Patient position: sitting Prep: DuraPrep and site prepped and draped Patient monitoring: heart rate, cardiac monitor, continuous pulse ox and blood pressure Approach: midline Location: L3-4 Injection technique: single-shot Needle Needle type: Pencan  Needle gauge: 24 G Needle length: 9 cm Needle insertion depth: 6 cm Assessment Sensory level: T4 Events: CSF return Additional Notes Patient tolerated procedure well. Adequate sensory level.

## 2023-01-13 NOTE — Progress Notes (Signed)
She feels some UCs  FHT decels to 80s that are difficult to trace but are distinct from maternal pulse UCs irregular  Cx 1-2/C/-2/BBOW/VTX  A/P: Progress of PTL with cervical change          Decelerations of FHT         D/W patient and her mother>recommend transfer to L&D for delivery and stop magnesium sulfate         D/W vaginal delivery but possible cesarean section if FHT not reassuring         She states she understands

## 2023-01-13 NOTE — Progress Notes (Signed)
C/O vaginal and rectal pressure Has felt a "couple" contractions  Today's Vitals   01/13/23 0318 01/13/23 0726 01/13/23 1146 01/13/23 1235  BP: 139/75 127/67 127/65 128/65  Pulse: (!) 120 98 89 90  Resp: '18 16 14 16  '$ Temp: 97.7 F (36.5 C) 98.8 F (37.1 C) 97.6 F (36.4 C) 98.8 F (37.1 C)  TempSrc: Oral Oral Oral Oral  SpO2: 100% 98% 98% 99%  PainSc:  0-No pain     There is no height or weight on file to calculate BMI.   FHT good accels, occasional early decel UCs difficult to trace, irregular Cx 1/90/-2/vtx  A/P: will start magnesium sulfate to allow fetal lung maturity         BMZ second dose due 1800         D/W above with patient and her mother, D/W side effects

## 2023-01-13 NOTE — Brief Op Note (Signed)
01/12/2023 - 01/13/2023  4:20 PM  PATIENT:  [name]  22 y.o. female  PRE-OPERATIVE DIAGNOSIS:  Non reassuring fetal heart rate  POST-OPERATIVE DIAGNOSIS:  Non reassuring fetal heart rate  PROCEDURE:  Procedure(s): CESAREAN SECTION (N/A)  SURGEON:  Surgeon(s) and Role:    * Everlene Farrier, MD - Primary  PHYSICIAN ASSISTANT:   ASSISTANTS: none   ANESTHESIA:   spinal  EBL:  513 mL   BLOOD ADMINISTERED:none  DRAINS: Urinary Catheter (Foley)   LOCAL MEDICATIONS USED:  NONE  SPECIMEN:  Source of Specimen:  placenta  DISPOSITION OF SPECIMEN:  PATHOLOGY  COUNTS:  YES  TOURNIQUET:  * No tourniquets in log *  DICTATION: .Other Dictation: Dictation Number [id number]  PLAN OF CARE: Admit to inpatient   PATIENT DISPOSITION:  PACU - hemodynamically stable.   Delay start of Pharmacological VTE agent (>24hrs) due to surgical blood loss or risk of bleeding: not applicable

## 2023-01-13 NOTE — Progress Notes (Signed)
No UCs, no bleeding Continues to leak small amounts of clear fluid  Today's Vitals   01/12/23 2018 01/12/23 2352 01/13/23 0318 01/13/23 0726  BP: 130/73 129/72 139/75 127/67  Pulse: 72 90 (!) 120 98  Resp: '17 18 18 16  '$ Temp: 98 F (36.7 C) 98 F (36.7 C) 97.7 F (36.5 C) 98.8 F (37.1 C)  TempSrc: Oral Oral Oral Oral  SpO2: 100% 100% 100% 98%  PainSc: 5    0-No pain   There is no height or weight on file to calculate BMI.   Uterus NT, soft  FHT cat one Baby very active UCs irritalibility  Results for orders placed or performed during the hospital encounter of 01/12/23 (from the past 24 hour(s))  Urinalysis, Routine w reflex microscopic -Urine, Clean Catch     Status: Abnormal   Collection Time: 01/12/23  5:37 PM  Result Value Ref Range   Color, Urine STRAW (A) YELLOW   APPearance CLEAR CLEAR   Specific Gravity, Urine 1.002 (L) 1.005 - 1.030   pH 7.0 5.0 - 8.0   Glucose, UA NEGATIVE NEGATIVE mg/dL   Hgb urine dipstick NEGATIVE NEGATIVE   Bilirubin Urine NEGATIVE NEGATIVE   Ketones, ur NEGATIVE NEGATIVE mg/dL   Protein, ur NEGATIVE NEGATIVE mg/dL   Nitrite NEGATIVE NEGATIVE   Leukocytes,Ua NEGATIVE NEGATIVE  HIV Antibody (routine testing w rflx)     Status: None   Collection Time: 01/12/23  6:09 PM  Result Value Ref Range   HIV Screen 4th Generation wRfx Non Reactive Non Reactive  CBC     Status: None   Collection Time: 01/12/23  6:09 PM  Result Value Ref Range   WBC 9.1 4.0 - 10.5 K/uL   RBC 4.46 3.87 - 5.11 MIL/uL   Hemoglobin 13.9 12.0 - 15.0 g/dL   HCT 41.7 36.0 - 46.0 %   MCV 93.5 80.0 - 100.0 fL   MCH 31.2 26.0 - 34.0 pg   MCHC 33.3 30.0 - 36.0 g/dL   RDW 12.8 11.5 - 15.5 %   Platelets 245 150 - 400 K/uL   nRBC 0.0 0.0 - 0.2 %  Type and screen [place]     Status: None   Collection Time: 01/12/23  6:10 PM  Result Value Ref Range   ABO/RH(D) O POS    Antibody Screen NEG    Sample Expiration      01/15/2023,2359 Performed at [facility], [address], [place], [place] [address]   Maryann Alar Test     Status: Abnormal   Collection Time: 01/12/23  6:27 PM  Result Value Ref Range   POCT Fern Test Positive = ruptured amniotic membanes   Rapid urine drug screen (hospital performed)     Status: None   Collection Time: 01/12/23  9:19 PM  Result Value Ref Range   Opiates NONE DETECTED NONE DETECTED   Cocaine NONE DETECTED NONE DETECTED   Benzodiazepines NONE DETECTED NONE DETECTED   Amphetamines NONE DETECTED NONE DETECTED   Tetrahydrocannabinol NONE DETECTED NONE DETECTED   Barbiturates NONE DETECTED NONE DETECTED    A/P: PPROM @ 52 5/7        [place]        Neonatology consult ordered        MFM U/S today        Latency ATB         Will start magnesium sulfate to allow BMZ maturity if she labors  Watch closely for labor, abruption, infection, fetal status        D/W patient above

## 2023-01-14 LAB — CBC
HCT: 31 % — ABNORMAL LOW (ref 36.0–46.0)
Hemoglobin: 10.5 g/dL — ABNORMAL LOW (ref 12.0–15.0)
MCH: 31.3 pg (ref 26.0–34.0)
MCHC: 33.9 g/dL (ref 30.0–36.0)
MCV: 92.5 fL (ref 80.0–100.0)
Platelets: 231 10*3/uL (ref 150–400)
RBC: 3.35 MIL/uL — ABNORMAL LOW (ref 3.87–5.11)
RDW: 12.7 % (ref 11.5–15.5)
WBC: 11.9 10*3/uL — ABNORMAL HIGH (ref 4.0–10.5)
nRBC: 0 % (ref 0.0–0.2)

## 2023-01-14 LAB — CULTURE, OB URINE: Culture: NO GROWTH

## 2023-01-14 LAB — CULTURE, BETA STREP (GROUP B ONLY)

## 2023-01-14 LAB — HIV ANTIBODY (ROUTINE TESTING W REFLEX): HIV Screen 4th Generation wRfx: NONREACTIVE

## 2023-01-14 MED ORDER — IBUPROFEN 600 MG PO TABS
600.0000 mg | ORAL_TABLET | Freq: Four times a day (QID) | ORAL | Status: DC
  Administered 2023-01-14 – 2023-01-17 (×12): 600 mg via ORAL
  Filled 2023-01-14 (×12): qty 1

## 2023-01-14 NOTE — Progress Notes (Signed)
Subjective: Postpartum Day 1: Cesarean Delivery s/s nrFHT, PPROM at 33.4 wga Patient reports tolerating PO and no problems voiding.    Objective: Vital signs in last 24 hours: Temp:  [97.6 F (36.4 C)-98.8 F (37.1 C)] 98 F (36.7 C) (03/07 0600) Pulse Rate:  [73-95] 73 (03/07 0600) Resp:  [14-33] 16 (03/07 0600) BP: (90-131)/(39-78) 111/49 (03/07 0600) SpO2:  [94 %-100 %] 96 % (03/07 0600)  Physical Exam:  General: alert and cooperative Lochia: appropriate Uterine Fundus: firm Incision: healing well, pressure dressing removed DVT Evaluation: No evidence of DVT seen on physical exam.  Recent Labs    01/13/23 1113 01/14/23 0426  HGB 12.0 10.5*  HCT 35.6* 31.0*    Assessment/Plan: Status post Cesarean section. Doing well postoperatively.  Continue current care. Baby [name] doing well in NICU - couplet care.    Tyson Dense, MD 01/14/2023, 8:11 AM

## 2023-01-14 NOTE — Lactation Note (Signed)
This note was copied from a baby's chart.  NICU Lactation Consultation Note  Patient Name: [name] [id number] Date: 01/14/2023 Age:22 years  Reason for consult: Initial assessment; 1st time breastfeeding; Primapara; Infant < 6lbs; NICU baby; Preterm <34wks  Subjective  Visited with family of 42 hours old pre-term NICU female; [name] is a P1 and reports she's pumping consistently around baby's feeding schedule and already getting small volumes of colostrum, praised her for her efforts. Reviewed pumping schedule, lactogenesis II and anticipatory guidelines.   Objective Infant data: Mother's Current Feeding Choice: Breast Milk and Donor Milk  Maternal data: [id number]  C-Section, Low Transverse Significant Breast History:: (++) breast changes during the pregnancy; she started leaking in the beginning of her second trimester  Current breast feeding challenges:: NICU admission  Does the patient have breastfeeding experience prior to this delivery?: No  Pumping frequency: 5 times since birth Pumped volume: 2 mL (2-8 ml) Flange Size: 24  Risk factor for low milk supply:: primipara, prematurity, infant separation, maternal blood Black of 620 cc.  Has patient been taught Hand Expression?: Yes  Hand Expression Comments: colostrum noted  Pump: Personal (Medela DEBP at home)  Assessment Infant: Feeding Status: Scheduled 8-11-2-5  Maternal: Milk volume: Normal  Intervention/Plan Interventions: Breast feeding basics reviewed; DEBP; Education; [employer] brochure  Tools: Pump; Flanges; Coconut oil Pump Education: Setup, frequency, and cleaning; Milk Storage  Plan of care: Encouraged pumping every 3 hours, ideally 8 pumping sessions/24 hours Breast massage and hand expression were also encouraged prior pumping  Maternal GOB present and supportive. All questions and concerns answered, family to contact [facility] services PRN.  Consult Status: NICU follow-up  NICU Follow-up  type: New admission follow up; Maternal D/C visit   [name] [name] 01/14/2023, 10:15 AM

## 2023-01-15 LAB — SURGICAL PATHOLOGY

## 2023-01-15 NOTE — Lactation Note (Signed)
This note was copied from a baby's chart.  NICU Lactation Consultation Note  Patient Name: [name] [id number] Date: 01/15/2023 Age:23 hours  Reason for consult: Follow-up assessment; 1st time breastfeeding; Primapara; NICU baby; Preterm <34wks; Engorgement   Subjective  LC in to visit with P80 Mom of preterm baby in the NICU.  Mom resting on her side in bed.  Mom's last pumping she expressed 20 ml.    Provided a hand's free pumping band as Mom hasn't been bilaterally pumping.  Breast are slightly engorged.  Set her up to hand's free pumping using 21 mm flanges.  Milk flowing and setting on pump changed to maintenance and encouraged pumping for 20-30 mins. Engorgement treatment reviewed if this pumping does not soften the breast.  Washing and drying bins provided and explained importance of not drying pump parts on the sink edge.  Mom did STS today with baby "[name]", encouraged continued STS and pumping after.  Objective Infant data: Mother's Current Feeding Choice: Breast Milk and Donor Milk  Infant feeding assessment Scheduled feedings 8-11-2    Maternal data: G1P0101  C-Section, Low Transverse Significant Breast History:: (++) breast changes during the pregnancy; she started leaking in the beginning of her second trimester  Current breast feeding challenges:: NICU admission   Does the patient have breastfeeding experience prior to this delivery?: No  Pumping frequency: Encouraged bilateral pumping every 2-3 hrs when awake Pumped volume: 20 mL Flange Size: 21  Risk factor for low milk supply:: primipara, prematurity, infant separation, maternal blood loss of 620 cc.  Has patient been taught Hand Expression?: Yes  Hand Expression Comments: colostrum noted     Pump: Personal (Medela DEBP at home)  Assessment Infant: No data recorded Feeding Status: Scheduled 8-11-2-5   Maternal: Milk volume: Normal   Intervention/Plan Interventions: Breast  feeding basics reviewed; Skin to skin; Breast massage; Hand express; DEBP  Tools: Pump; Flanges; Hands-free pumping top Pump Education: Setup, frequency, and cleaning; Milk Storage (Mom to use maintenance setting)  Plan: Consult Status: NICU follow-up  NICU Follow-up type: Verify absence of engorgement  Discharge Education: Engorgement and breast care    [name] 01/15/2023, 2:28 PM

## 2023-01-15 NOTE — Progress Notes (Signed)
Subjective: Postpartum Day 2: Cesarean Delivery Patient reports incisional pain, tolerating PO, and no problems voiding.    Objective: Vital signs in last 24 hours: Temp:  [98 F (36.7 C)-98.4 F (36.9 C)] 98.1 F (36.7 C) (03/08 0840) Pulse Rate:  [73-88] 88 (03/08 0840) Resp:  [16-18] 16 (03/08 0840) BP: (126-136)/(74-85) 136/84 (03/08 0840) SpO2:  [96 %-98 %] 98 % (03/08 0840) Weight:  [65 kg] 65 kg (03/07 2016)  Physical Exam:  General: alert, cooperative, appears stated age, and mild distress Lochia: appropriate Uterine Fundus: firm Incision: healing well DVT Evaluation: No evidence of DVT seen on physical exam.  Recent Labs    01/13/23 1113 01/14/23 0426  HGB 12.0 10.5*  HCT 35.6* 31.0*    Assessment/Plan: Status post Cesarean section. Doing well postoperatively.  Continue current care.  Luz Lex, MD 01/15/2023, 10:10 AM

## 2023-01-15 NOTE — Clinical Social Work Maternal (Signed)
CLINICAL SOCIAL WORK MATERNAL/CHILD NOTE  Patient Details  Name: [name] MRN: [id number] Date of Birth: 2001/01/20  Date:  01/15/2023  Clinical Social Worker Initiating Note:  Laurey Arrow Date/Time: Initiated:  01/15/23/1103     Child's Name:  [name]   Biological Parents:  Mother (MOB declined to provide any information about FOB.)   Need for Interpreter:  None   Reason for Referral:  Behavioral Health Concerns   Address:  [address] [place] [address]    Phone number:  [phone] (home)     Additional phone number:   Household Members/Support Persons (HM/SP):    (per MOB she lives with her father, stepmother, and younger brother.)   HM/SP Name Relationship DOB or Age  HM/SP -1        HM/SP -2        HM/SP -3        HM/SP -4        HM/SP -5        HM/SP -6        HM/SP -7        HM/SP -8          Natural Supports (not living in the home):  Immediate Family, Extended Family, Parent   Professional Supports: Transport planner (Therapist is Research scientist (life sciences) with [employer])   Employment: Part-time   Type of Work: Psychologist, 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   Homebound arranged:    Museum/gallery curator Resources:  [employer]   Other Resources:  [employer], Physicist, medical     Cultural/Religious Considerations Which May Impact Care:  None reported  Strengths:  Ability to meet basic needs  , Home prepared for child  , Understanding of illness (MOB has a list of pediatrician and has not decided on a practice at this time.)   Psychotropic Medications:         Pediatrician:       Pediatrician List:   [place]      Pediatrician Fax Number:    Risk Factors/Current Problems:  Mental Health Concerns     Cognitive State:  Able to Concentrate  , Alert  , Goal Oriented  , Insightful  , Linear Thinking     Mood/Affect:   Comfortable  , Interested  , Happy  , Bright  , Calm     CSW Assessment: CSW met with MOB at in room 326 to complete an assessment for MH hx. When CSW arrived, MOB was pumping; CSW offered to return at a later time and MOB declined.  Infant was asleep in his isolette and MOB's stepmother was assisting MOB pump.  With MOB's permission, CSW asked MOB's stepmother to leave to room in order to assess MOB in private and to be HIPAA compliant. MOB was polite, easy to engage, and receptive to meeting with CSW.   CSW asked about MOB's about her MH hx. MOB acknowledged a dx of anxiety, depression, and borderline personality; MOB denied bipolar disorder.  Per MOB she was dx around age 22 and initially was treated with medication.  Per MOB she has discontinued her medications and her symptoms have been managed by outpatient counseling. CSW provided education regarding the baby blues period vs. perinatal mood disorders, discussed treatment and gave resources for mental health follow up if concerns arise.  CSW recommends  self-evaluation during the postpartum time period using the New Mom Checklist from Postpartum Progress and encouraged MOB to contact a medical professional if symptoms are noted at any time.  MOB presented with insight and awareness and denied SI, HI, and DV when assessed for safety.  Per MOB, she has the support of her parents and FOB from a distance. MOB also shared feeling comfortable seeking help if help is needed.   MOB communicated feeling well informed by NICU team and she denied having any questions or concerns. MOB also denied having any barriers to visiting with infant post MOB's discharge.   MOB reported having all essential items for infant including a new car seat and 3 different infant beds.   CSW provided review of Sudden Infant Death Syndrome (SIDS) precautions.    CSW will continue to offer resources and supports to family while infant remains in NICU.    CSW Plan/Description:   Psychosocial Support and Ongoing Assessment of Needs, Sudden Infant Death Syndrome (SIDS) Education, Perinatal Mood and Anxiety Disorder (PMADs) Education, Other Patient/Family Education   Laurey Arrow, MSW, LCSW Clinical Social Work [phone]  Dimple Nanas, LCSW 01/15/2023, 11:07 AM

## 2023-01-16 NOTE — Progress Notes (Signed)
Notified Dr Corinna Capra of pt's BP 130/85  Nd 135/82, no clonus, no headache, no blurred vision, +2 DTRs. New order received to call MD for BP 160/110.

## 2023-01-16 NOTE — Lactation Note (Signed)
This note was copied from a baby's chart. Lactation Consultation Note  Patient Name: Boy Mellissa Thau [id number] Date: 01/16/2023 Age:23 hours Reason for consult: Follow-up assessment;RN request;Infant < 6lbs;Infant weight loss (5.76% WL)  LC entered the room and the infant was in the bassinet.  Per the birth parent, she has been having some nipple pain with pumping.  LC assessed the breast tissue and a white area was noted near both nipples.  The birth parent said that the white area was not painful to the touch.  [place] stated that it may be scar tissue from her nipple piercing.  LC suggested that the birth parent use coconut oil.  If the area is not better within a few days, the birth parent will speak with her doctor.  The birth parent pumped so that [facility] could check the flange size.  The birth parent said that he nipple and areola are being sucked into the flange.  LC suggested trying a 19 or an insert when she gets home.  LC also suggested using coconut oil in the flange when pumping.  The birth parent will call for lactation assistance if needed.    Maternal Data    Feeding    LATCH Score                    Lactation Tools Discussed/Used Flange Size: 21  Interventions Interventions: Education  Discharge    Consult Status Consult Status: Follow-up Date: 01/17/23 Follow-up type: In-patient    Adan Baehr P Xavia Kniskern 01/16/2023, 1:50 PM

## 2023-01-16 NOTE — Progress Notes (Signed)
Subjective: Postpartum Day 3: Cesarean Delivery Patient reports nausea, vomiting, incisional pain, tolerating PO, and no problems voiding.    Objective: Vital signs in last 24 hours: Temp:  [97.8 F (36.6 C)-98 F (36.7 C)] 98 F (36.7 C) (03/09 0850) Pulse Rate:  [62-65] 65 (03/09 0850) Resp:  [16] 16 (03/09 0850) BP: (124-130)/(81-85) 124/85 (03/09 0850) SpO2:  [97 %-100 %] 97 % (03/09 0850)  Physical Exam:  General: alert, cooperative, appears stated age, and no distress Lochia: appropriate Uterine Fundus: firm Incision: healing well DVT Evaluation: No evidence of DVT seen on physical exam.  Recent Labs    01/13/23 1113 01/14/23 0426  HGB 12.0 10.5*  HCT 35.6* 31.0*    Assessment/Plan: Status post Cesarean section. Doing well postoperatively.  Continue current care.  Luz Lex, MD 01/16/2023, 9:57 AM

## 2023-01-17 MED ORDER — OXYCODONE HCL 5 MG PO TABS: 5.0000 mg | ORAL_TABLET | Freq: Four times a day (QID) | ORAL | 0 refills | Status: AC | PRN

## 2023-01-17 MED ORDER — IBUPROFEN 600 MG PO TABS: 600.0000 mg | ORAL_TABLET | Freq: Four times a day (QID) | ORAL | 0 refills | Status: AC | PRN

## 2023-01-17 NOTE — Discharge Summary (Signed)
Postpartum Discharge Summary      Patient Name: [name] DOB: 16-Nov-2000 MRN: [id number]  Date of admission: 01/12/2023 Delivery date:01/13/2023  Delivering provider: Everlene Farrier  Date of discharge: 01/17/2023  Admitting diagnosis: Preterm premature rupture of membranes (PPROM) with unknown onset of labor [O42.919] Intrauterine pregnancy: [redacted]w[redacted]d    Secondary diagnosis:  Principal Problem:   Preterm premature rupture of membranes (PPROM) with unknown onset of labor  Additional problems:      Discharge diagnosis: Preterm Pregnancy Delivered and PPROM                                               Post partum procedures:   Augmentation: N/A Complications: None  Hospital course: Sceduled C/S   22y.o. yo G1P0101 at [id number] admitted to the hospital 01/12/2023 for scheduled cesarean section with the following indication:Non-Reassuring FHR.Delivery details are as follows:  Membrane Rupture Time/Date: 6:15 PM ,01/12/2023   Delivery Method:C-Section, Low Transverse  Details of operation can be found in separate operative note.  Patient had a postpartum course complicated by baby in NICU.  She is ambulating, tolerating a regular diet, passing flatus, and urinating well. Patient is discharged home in stable condition on  01/17/23        Newborn Data: Birth date:01/13/2023  Birth time:3:41 PM  Gender:Female  Living status:Living  Apgars:5 ,9  Weight:2430 g     Magnesium Sulfate received: Yes: Neuroprotection BMZ received: Yes Rhophylac:N/A MMR:N/A T-DaP:Given prenatally Flu: N/A Transfusion:No  Physical exam  Vitals:   01/16/23 1532 01/16/23 2001 01/17/23 0600 01/17/23 0935  BP: 135/82 133/88 117/76 136/87  Pulse: 68   (!) 58  Resp: '16 18 18 17  '$ Temp:  98.5 F (36.9 C) 98 F (36.7 C) 98.1 F (36.7 C)  TempSrc:  Oral Oral Oral  SpO2:    100%  Weight:      Height:       General: alert, cooperative, and no distress Lochia: appropriate Uterine Fundus: firm Incision:  Healing well with no significant drainage DVT Evaluation: No evidence of DVT seen on physical exam. Labs: Lab Results  Component Value Date   WBC 11.9 (H) 01/14/2023   HGB 10.5 (L) 01/14/2023   HCT 31.0 (L) 01/14/2023   MCV 92.5 01/14/2023   PLT 231 01/14/2023      Latest Ref Rng & Units 03/12/2016    2:16 PM  CMP  Glucose 65 - 99 mg/dL 101   BUN 6 - 20 mg/dL 9   Creatinine 0.50 - 1.00 mg/dL 0.61   Sodium 135 - 145 mmol/L 140   Potassium 3.5 - 5.1 mmol/L 3.8   Chloride 101 - 111 mmol/L 105   CO2 22 - 32 mmol/L 25   Calcium 8.9 - 10.3 mg/dL 9.1   Total Protein 6.5 - 8.1 g/dL 6.8   Total Bilirubin 0.3 - 1.2 mg/dL 0.2   Alkaline Phos 50 - 162 U/L 116   AST 15 - 41 U/L 26   ALT 14 - 54 U/L 18    Edinburgh Score:    01/14/2023    4:23 PM  Edinburgh Postnatal Depression Scale Screening Tool  I have been able to laugh and see the funny side of things. 0  I have looked forward with enjoyment to things. 0  I have blamed myself unnecessarily when things went wrong.  1  I have been anxious or worried for no good reason. 1  I have felt scared or panicky for no good reason. 0  Things have been getting on top of me. 1  I have been so unhappy that I have had difficulty sleeping. 0  I have felt sad or miserable. 0  I have been so unhappy that I have been crying. 0  The thought of harming myself has occurred to me. 0  Edinburgh Postnatal Depression Scale Total 3      After visit meds:  Allergies as of 01/17/2023   No Known Allergies      Medication List     TAKE these medications    ibuprofen 600 MG tablet Commonly known as: ADVIL Take 1 tablet (600 mg total) by mouth every 6 (six) hours as needed for mild pain, moderate pain or cramping.   oxyCODONE 5 MG immediate release tablet Commonly known as: Oxy IR/ROXICODONE Take 1-2 tablets (5-10 mg total) by mouth every 6 (six) hours as needed for moderate pain, severe pain or breakthrough pain.   prenatal multivitamin Tabs  tablet Take 1 tablet by mouth daily.         Discharge home in stable condition Infant Feeding: Breast Infant Disposition:NICU Discharge instruction: per After Visit Summary and Postpartum booklet. Activity: Advance as tolerated. Pelvic rest for 6 weeks.  Diet: routine diet Anticipated Birth Control: Unsure Postpartum Appointment:6 weeks Additional Postpartum F/U:    Future Appointments:No future appointments. Follow up Visit:      01/17/2023 Luz Lex, MD

## 2023-01-18 ENCOUNTER — Other Ambulatory Visit (HOSPITAL_COMMUNITY): Payer: BLUE CROSS/BLUE SHIELD

## 2023-01-18 ENCOUNTER — Ambulatory Visit: Payer: Self-pay

## 2023-01-18 NOTE — Lactation Note (Signed)
This note was copied from a baby's chart.  NICU Lactation Consultation Note  Patient Name: [name] [id number] Date: 01/18/2023 Age:23 days  Reason for consult: Follow-up assessment; NICU baby; 1st time breastfeeding; Primapara; Late-preterm 34-36.6wks; Breastfeeding assistance; RN request; Infant < 6lbs  Subjective Visited with family of 27 70/29 weeks old AGA NICU female twice for feeding assist. Baby did not wake up for the 12 pm feeding, [name] requested this LC to come back for the 3 pm feeding. Baby "[name]" awake and alert but not cueing, this LC took him to the R breast in cross cradle hold but he did not latch at this time (see LATCH score). She voiced that pumping is going well and her supply continues to increase. She continues pumping on a 3-hour schedule trying to follow baby's feeding schedule, praised her for her efforts. Reviewed normalcy feeding patterns at this age and some pre-feeding activities. Let mom & baby engaging in STS care when exiting the room.   Objective Infant data: Mother's Current Feeding Choice: Breast Milk  Infant feeding assessment Scale for Readiness: 3  Maternal data: G1P0101  C-Section, Low Transverse Pumping frequency: 8 times/24 hours Pumped volume: 120 mL Flange Size: 21 Pump: Personal (Medela DEBP at home)  Assessment Infant: LATCH Documentation Latch: 0 (baby awake and alert but he won't open his mouth to latch at the breast or to do paci dips) Audible Swallowing: 0 Type of Nipple: 2 Comfort (Breast/Nipple): 2 Hold (Positioning): 1 LATCH Score: 5  Feeding Status: Scheduled 9-12-3-6  Maternal: Milk volume: Normal Breasts are filling in but no S/S of engorgement at this time  Intervention/Plan Interventions: Breast feeding basics reviewed; Breast massage; Hand express; Pre-pump if needed; Breast compression; Adjust position; Support pillows; DEBP; Education; Infant Driven Feeding Algorithm education  Tools: Pump;  Flanges; Hands-free pumping top Pump Education: Setup, frequency, and cleaning; Milk Storage  Plan of care: Encouraged pumping every 3 hours, ideally 8 pumping sessions/24 hours She'll continue taking baby to breast on feeding cues around feeding times She'll start working on some pre-feeding activities such as paci dips and holding baby STS during gavage feedings    FOB present. All questions and concerns answered, family to contact [facility] services PRN.  Consult Status: NICU follow-up  NICU Follow-up type: Weekly NICU follow up; Assist with IDF-1 (Mother to pre-pump before breastfeeding)   [name] [name] 01/18/2023, 3:22 PM

## 2023-01-20 ENCOUNTER — Ambulatory Visit: Payer: Self-pay

## 2023-01-20 NOTE — Lactation Note (Signed)
This note was copied from a baby's chart.  NICU Lactation Consultation Note  Patient Name: [name] [id number] Date: 01/20/2023 Age:23 days   Subjective  LC talked briefly with Mom.  She is concerned as her home pump isn't as strong.  Since she didn't go through her insurance, [facility] emailed a STORK pump request and it was delivered.  LC brought it to baby's room where FOB is there holding baby.  Objective Infant data: No data recorded Infant feeding assessment Scale for Readiness: 2     Maternal data: G1P0101  C-Section, Low Transverse  Pump: Stork Pump [employer] DEBP)  Assessment Infant: Feeding Status: Scheduled 9-12-3-6   [name] 01/20/2023, 5:33 PM

## 2023-01-22 ENCOUNTER — Ambulatory Visit (HOSPITAL_COMMUNITY): Payer: Self-pay

## 2023-01-22 NOTE — Lactation Note (Signed)
This note was copied from a baby's chart.  NICU Lactation Consultation Note  Patient Name: [name] [id number] Date: 01/22/2023 Age:23 days  Reason for consult: Follow-up assessment; NICU baby; Late-preterm 34-36.6wks; Infant < 6lbs; 1st time breastfeeding; Primapara; Other (Comment) (SLP request)  Subjective Visited with family of 48 0/7 AGA NICU female; [name] is a P1 and reports she continues pumping consistently, her volumes remain robust, praised her for her efforts. She voiced that since she switched to the Spectra pump, she's now getting more volume and it's emptying her breasts. She had some questions about freezing her milk, the milk bank has started a second bin for her; reviewed the difference between overstock and oversupply; [name] is happy with her supply as it is now, she won't be looking into down regulating at least until her calibration period is over. She also reported the nipple trauma she experienced earlier this week shows improvement, scabs are barely noticeable, provided comfort gels and breast shells. LC ready to assist for the 3 pm feeding but baby "[name]" did not wake up; [name] requested another feeding assist for 01/23/2023 at 12 pm.  Objective Infant data: Mother's Current Feeding Choice: Breast Milk  Infant feeding assessment Scale for Readiness: 3 Scale for Quality: 4  Maternal data: G1P0101  C-Section, Low Transverse Pumping frequency: 8 times/24 hours Pumped volume: 240 mL Flange Size: 21 Pump: Stork Pump (Spectra DEBP)  Assessment Infant: LATCH Documentation Latch: 1 Audible Swallowing: 0 Type of Nipple: 2 Comfort (Breast/Nipple): 2 Hold (Positioning): 1 LATCH Score: 6  Per SLP Feeding Status: Scheduled 9-12-3-6  Maternal: Milk volume: Abundant  Intervention/Plan Interventions: Breast feeding basics reviewed; Comfort gels; DEBP; Shells; Coconut oil; Education  Tools: Pump; Flanges; Coconut oil; Comfort gels;  Shells Pump Education: Setup, frequency, and cleaning; Milk Storage  Plan of care: Encouraged pumping every 3 hours, ideally 8 pumping sessions/24 hours She'll continue taking baby to pumped breast on feeding cues around feeding times She'll continue working on pre-feeding activities such as paci dips and holding baby STS during gavage feedings    FOB present. All questions and concerns answered, family to contact [facility] services PRN.  Consult Status: NICU follow-up  NICU Follow-up type: Assist with IDF-1 (Mother to pre-pump before breastfeeding)   Rushil Kimbrell Francene Boyers 01/22/2023, 4:11 PM

## 2023-01-23 ENCOUNTER — Ambulatory Visit (HOSPITAL_COMMUNITY): Payer: Self-pay

## 2023-01-23 NOTE — Lactation Note (Signed)
This note was copied from a baby's chart.  NICU Lactation Consultation Note  Patient Name: Boy Pia Nanez [id number] Date: 01/23/2023 Age:22 years  Reason for consult: Follow-up assessment; Mother's request; RN request; NICU baby; Late-preterm 34-36.6wks; Infant < 6lbs  Subjective  LC in to visit with P1 Mom of PMA [redacted]w[redacted]d infant.  Baby is starting to show feeding cues and Mom reports she let baby latch yesterday.  Baby was sucking and she was worried and took baby off the breast after 2 minutes as baby had milk running down his mouth.  Baby received a full gavage feeding and baby had an emesis.    Due to an abundant milk supply, LC suggested she pre-pump for 5 mins prior to latching baby.  Baby showing feeding cues, but noted to be a little sleepy.    Baby positioned in cross cradle hold and offering Mom guidance on breast support and baby's head support.  Baby opened his mouth and when brought to the breast, he sucked a couple times and slipped onto nipple.  LC initiated a 20 mm nipple shield and baby able to attain a deeper latch and suck a couple times without slipping onto nipple.  Baby baby did not continue to suck after the first 2 sucks.  Baby not fussy, but not sucking with deep jaw extensions.  Baby enjoyed being on the breast, nipple pulled well into shield, some milk noted in tip, but no signs of milk transfer at this feeding.  Plan- 1- STS with baby as much as possible 2-Offer the breast with feeding cues, asking for help prn (LC will try to return at 3pm feeding) Use IDF algorithm  3-Pump both breasts after feeding when baby is gavage fed.   Objective Infant data: Mother's Current Feeding Choice: Breast Milk  Infant feeding assessment Scale for Readiness: 2     Maternal data: G1P0101  C-Section, Low Transverse Pumping frequency: 8 times per 24 hrs Pumped volume: 240 mL Flange Size: 21   Pump: Stork Pump (Spectra DEBP)  Assessment Infant: LATCH  Documentation Latch: 1 Audible Swallowing: 0 Type of Nipple: 2 Comfort (Breast/Nipple): 2 Hold (Positioning): 1 LATCH Score: 6   Feeding Status: IDF-1   Maternal: Milk volume: Abundant   Intervention/Plan Interventions: Breast feeding basics reviewed; Assisted with latch; Skin to skin; Breast massage; Hand express; Pre-pump if needed; Education; Position options; Support pillows; Adjust position; DEBP  Tools: Pump; Flanges Pump Education: Setup, frequency, and cleaning; Milk Storage  Plan: Consult Status: NICU follow-up  NICU Follow-up type: Assist with IDF-1 (Mother to pre-pump before breastfeeding)   [name] 01/23/2023, 1:19 PM

## 2023-01-25 ENCOUNTER — Telehealth (HOSPITAL_COMMUNITY): Payer: Self-pay

## 2023-01-25 NOTE — Telephone Encounter (Signed)
Patient reports feeling good. Patient declines questions/concerns about her health and healing.  Baby is currently in the [facility] NICU.  EPDS score is 4.  [facility] [place] [facility]   01/25/23,1731

## 2023-01-31 ENCOUNTER — Ambulatory Visit (HOSPITAL_COMMUNITY): Payer: Self-pay

## 2023-01-31 NOTE — Lactation Note (Signed)
This note was copied from a baby's chart.  NICU Lactation Consultation Note  Patient Name: [name] [id number] Date: 01/31/2023 Age:23 wk.o.  Reason for consult: Weekly NICU follow-up; Mother's request; NICU baby; Primapara; 1st time breastfeeding; Late-preterm 34-36.6wks; Infant < 6lbs; Breastfeeding assistance; Other (Comment) (Weighted feed per SLP request)  Subjective Visited with family of 6 66/48 weeks old AGA NICU female twice, the first time to check on pumping status and the second time to assist with a weighted feed. [name] is a P1 and reports baby "[name]" is consistently going to breast and waking up before his feeding time. She pumps consistently for about 2-5 minutes prior taking him to breast because he can't handle the flow yet, she has an abundant supply and strong MER that the milk will be spraying for the first few minutes into her pumping session. She started to skip her 3 am pumping session since her supply is robust and her calibration period is over. [place] assisted with the 6 pm feeding; [name] pre-pumped the R side but not the L one. This LC did the pre-post weight (see assessment). Baby's volume per feeding is at 54 ml; no gavage feeding per IDF protocol for this feeding. Baby sustained NS patters for 2/3 of the feedings and transfer rates suggests feeding readiness for an ad lib trial; NP will put order to start it tonight.   Objective Infant data: Mother's Current Feeding Choice: Breast Milk  Infant feeding assessment Scale for Readiness: 1 Scale for Quality: 2  Maternal data: G1P0101  C-Section, Low Transverse Pumping frequency: 6-7 times/24 hours Pumped volume: 180 mL (180-210 ml after taking baby to breast; 240 if baby doesn't go to breast) Flange Size: 21 Pump: Stork Pump (Spectra DEBP)  Assessment Infant: LATCH Documentation Latch: 2 (with NS # 16; Ms. Starliper voiced she tried latching without the shield but baby bit her) Audible Swallowing:  2 (pool of EBM in NS # 16 at the end of this feeding) Type of Nipple: 2 Comfort (Breast/Nipple): 2 Hold (Positioning): 2 (minimal assistance needed) LATCH Score: 10  Starting feeding time: 1743 Ending feeding time: 1803 Active feeding time with NS: 13 minutes Total feeding time including NNS and pauses: 19 minutes  Baby's weight prior feeding: 2707 g. Baby's weight post-feeding: 2778 g. Milk transfer: 71 ml.  Feeding Status: Scheduled 9-12-3-6; IDF-2 (Transitioning to ad lib feeding trial)  Maternal: Milk volume: Abundant Breast are soft, no S/S of edema or engorgement at this time  Intervention/Plan Interventions: Interventions: Breast feeding basics reviewed; Assisted with latch; Skin to skin; Pre-pump if needed; Breast compression; Adjust position; Support pillows; Position options; DEBP; Education; Infant Driven Feeding Algorithm education Tools: Pump; Flanges; Coconut oil; Nipple Shields Pump Education: Setup, frequency, and cleaning; Milk Storage Nipple shield size: 16  Plan: Encouraged to continue pumping every 3 hours, ideally 7-8 pumping sessions/24 hours  She'll continue taking baby to a semi-pumped breast STS on feeding cues using NS # 16 PRN   No other support person at this time. All questions and concerns answered, family to contact [facility] services PRN.  Consult Status: NICU follow-up  NICU Follow-up type: Assist with IDF-1 (Mother to pre-pump before breastfeeding); Assist with IDF-2 (Mother does not need to pre-pump before breastfeeding); Weekly NICU follow up   [place] 01/31/2023, 6:27 PM

## 2023-02-02 ENCOUNTER — Ambulatory Visit (HOSPITAL_COMMUNITY): Payer: Self-pay

## 2023-02-02 NOTE — Lactation Note (Signed)
This note was copied from a baby's chart.  NICU Lactation Consultation Note  Patient Name: [name] [id number] Date: 02/02/2023 Age:23 wk.o.  Reason for consult: Follow-up assessment; NICU baby; Late-preterm 34-36.6wks; Primapara; 1st time breastfeeding   Subjective  LC met with P1 Mom of LPT baby in the NICU.  Mom has been exclusively breastfeeding baby "[name]" ad lib.  Baby has consistently been gaining 30-60 gm per day.   Talked about how to down grade her milk supply with less pumping in a gradual decrease in time pumping to avoid engorgement.  Mom will continue pre-pumping his "let down" which is forceful, but will only pump for comfort after baby feeds, as long as baby is contented after breastfeeding and breast has softened after feeding.  Talked about the importance of OP lactation f/u and Mom is interested.  Message sent to [facility] RN IBCLC at [facility].  Objective Infant data: AD LIB Infant feeding assessment Scale for Readiness: 1 Scale for Quality: 2     Maternal data: G1P0101  C-Section, Low Transverse Pumping frequency: pre-pumping before breastfeeding and pumping after Pumped volume: 240 mL (per pumping)   Pump: Stork Pump (Spectra DEBP)  Assessment Infant: LATCH Documentation Latch: 2 Audible Swallowing: 2 Type of Nipple: 2 Comfort (Breast/Nipple): 2 Hold (Positioning): 2 LATCH Score: 10   Feeding Status: Ad lib   Maternal: Milk volume: Abundant   Intervention/Plan Interventions: Interventions: Breast feeding basics reviewed  Discharge Education: Engorgement and breast care; Warning signs for feeding baby; Outpatient recommendation; Outpatient Epic message sent  Tools: Pump  Plan: Consult Status: NICU follow-up  NICU Follow-up type: Weekly NICU follow up; Baby's discharge   [name] 02/02/2023, 9:40 AM
